# Patient Record
Sex: Male | Born: 1950
Health system: Southern US, Community
[De-identification: ages and names within clinical notes are randomized; demographics above are authoritative.]

## PROBLEM LIST (undated history)

## (undated) DIAGNOSIS — I10 Essential (primary) hypertension: Secondary | ICD-10-CM

## (undated) DIAGNOSIS — M109 Gout, unspecified: Secondary | ICD-10-CM

## (undated) HISTORY — DX: Gout, unspecified: M10.9

## (undated) HISTORY — DX: Essential (primary) hypertension: I10

---
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
## 2000-08-16 ENCOUNTER — Emergency Department (HOSPITAL_COMMUNITY): Admission: EM | Admit: 2000-08-16 | Discharge: 2000-08-16 | Payer: Self-pay | Admitting: Emergency Medicine

## 2000-08-31 ENCOUNTER — Emergency Department (HOSPITAL_COMMUNITY): Admission: EM | Admit: 2000-08-31 | Discharge: 2000-08-31 | Payer: Self-pay | Admitting: Emergency Medicine

## 2003-11-17 ENCOUNTER — Ambulatory Visit (HOSPITAL_COMMUNITY): Admission: RE | Admit: 2003-11-17 | Discharge: 2003-11-17 | Payer: Self-pay | Admitting: Family Medicine
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
## 2019-07-22 ENCOUNTER — Ambulatory Visit: Payer: Medicare HMO | Attending: Internal Medicine

## 2019-07-22 ENCOUNTER — Ambulatory Visit
Admission: EM | Admit: 2019-07-22 | Discharge: 2019-07-22 | Disposition: A | Discharge status: Home / Self Care | Payer: Self-pay

## 2019-07-22 DIAGNOSIS — Z20822 Contact with and (suspected) exposure to covid-19: Secondary | ICD-10-CM

## 2019-07-24 LAB — NOVEL CORONAVIRUS, NAA: SARS-CoV-2, NAA: NOT DETECTED

## 2019-09-03 ENCOUNTER — Ambulatory Visit: Payer: Self-pay | Admitting: Podiatry

## 2019-09-08 ENCOUNTER — Encounter: Payer: Self-pay | Admitting: Sports Medicine

## 2019-09-08 ENCOUNTER — Ambulatory Visit: Payer: Medicare HMO | Admitting: Sports Medicine

## 2019-09-08 ENCOUNTER — Other Ambulatory Visit: Payer: Self-pay

## 2019-09-08 ENCOUNTER — Other Ambulatory Visit: Payer: Self-pay | Admitting: Sports Medicine

## 2019-09-08 ENCOUNTER — Ambulatory Visit (INDEPENDENT_AMBULATORY_CARE_PROVIDER_SITE_OTHER): Payer: Medicare HMO

## 2019-09-08 VITALS — BP 175/80 | HR 79 | Temp 97.2°F

## 2019-09-08 DIAGNOSIS — M79671 Pain in right foot: Secondary | ICD-10-CM

## 2019-09-08 DIAGNOSIS — I739 Peripheral vascular disease, unspecified: Secondary | ICD-10-CM

## 2019-09-08 DIAGNOSIS — M25572 Pain in left ankle and joints of left foot: Secondary | ICD-10-CM | POA: Diagnosis not present

## 2019-09-08 DIAGNOSIS — M79672 Pain in left foot: Secondary | ICD-10-CM | POA: Diagnosis not present

## 2019-09-08 DIAGNOSIS — M659 Synovitis and tenosynovitis, unspecified: Secondary | ICD-10-CM | POA: Diagnosis not present

## 2019-09-08 DIAGNOSIS — G8929 Other chronic pain: Secondary | ICD-10-CM

## 2019-09-08 DIAGNOSIS — M10042 Idiopathic gout, left hand: Secondary | ICD-10-CM | POA: Diagnosis not present

## 2019-09-08 DIAGNOSIS — Z8739 Personal history of other diseases of the musculoskeletal system and connective tissue: Secondary | ICD-10-CM

## 2019-09-08 DIAGNOSIS — M1812 Unilateral primary osteoarthritis of first carpometacarpal joint, left hand: Secondary | ICD-10-CM | POA: Diagnosis not present

## 2019-09-08 DIAGNOSIS — M19042 Primary osteoarthritis, left hand: Secondary | ICD-10-CM | POA: Diagnosis not present

## 2019-09-08 DIAGNOSIS — M65971 Unspecified synovitis and tenosynovitis, right ankle and foot: Secondary | ICD-10-CM

## 2019-09-08 DIAGNOSIS — M25571 Pain in right ankle and joints of right foot: Secondary | ICD-10-CM | POA: Diagnosis not present

## 2019-09-08 DIAGNOSIS — M19079 Primary osteoarthritis, unspecified ankle and foot: Secondary | ICD-10-CM | POA: Diagnosis not present

## 2019-09-08 DIAGNOSIS — M65972 Unspecified synovitis and tenosynovitis, left ankle and foot: Secondary | ICD-10-CM

## 2019-09-08 DIAGNOSIS — M79642 Pain in left hand: Secondary | ICD-10-CM | POA: Diagnosis not present

## 2019-09-08 NOTE — Patient Instructions (Addendum)
For tennis shoes recommend:  Anne Shutter [employer]is New balance [place] Can be purchased at [employer] or [employer]  Vionic  SAS Can be purchased at [employer] or [employer]   For work shoes recommend: Pharmacologist Work [employer]  Can be purchased at a variety of places or Scientist, product/process development   For casual shoes recommend: Vionic  Can be purchased at [employer] or [employer]   For Over [employer] recommend: [employer] Can be purchased in office/[facility] [employer] Can be purchased at [employer] or [employer] Can be purchased at [employer] for patients with Gout  Gout defined-Gout occurs when urate crystals accumulate in your joint causing the inflammation and intense pain of gout attack.  Urate crystals can form when you have high levels of uric acid in your blood.  Your body produces uric acid when it breaks down prurines-substances that are found naturally in your body, as well as in certain foods such as organ meats, anchioves, herring, asparagus, and mushrooms.  Normally uric acid dissolves in your blood and passes through your kidneys into your urine.  But sometimes your body either produces too much uric acid or your kidneys excrete too little uric acid.  When this happens, uric acid can build up, forming sharp needle-like urate crystals in a joint or surrounding tissue that cause pain, inflammation and swelling.    Gout is characterized by sudden, severe attacks of pain, redness and tenderness in joints, often the joint at the base of the big toe.  Gout is complex form of arthritis that can affect anyone.  Men are more likely to get gout but women become increasingly more susceptible to gout after menopause.  An acute attack of gout can wake you up in the middle of the night with the sensation that your big toe is on fire.  The affected joint is hot, swollen and so tender that even the weight or the sheet on it may seem intolerable.  If you  experience symptoms of an acute gout attack it is important to your doctor as soon as the symptoms start.  Gout that goes untreated can lead to worsening pain and joint damage.  Risk Factors:  You are more likely to develop gout if you have high levels of uric acid in your body.    Factors that increase the uric acid level in your body include:  Lifestyle factors.  Excessive alcohol use-generally more than two drinks a day for men and more than one for women increase the risk of gout.  Medical conditions.  Certain conditions make it more likely that you will develop gout.  These include hypertension, and chronic conditions such as diabetes, high levels of fat and cholesterol in the blood, and narrowing of the arteries.  Certain medications.  The uses of Thiazide diuretics- commonly used to treat hypertension and low dose aspirin can also increase uric acid levels.  Family history of gout.  If other members of your family have had gout, you are more likely to develop the disease.  Age and sex. Gout occurs more often in men than it does in women, primarily because women tend to have lower uric acid levels than men do.  Men are more likely to develop gout earlier usually between the ages of 73-50- whereas women generally develop signs and symptoms after menopause.    Tests and diagnosis:  Tests to help diagnose gout may include:  Blood test.  Your doctor may recommend  a blood test to measure the uric acid level in your blood .  Blood tests can be misleading, though.  Some people have high uric acid levels but never experience gout.  And some people have signs and symptoms of gout, but don't have unusual levels of uric acid in their blood.  Joint fluid test.  Your doctor may use a needle to draw fluid from your affected joint.  When examined under the microscope, your joint fluid may reveal urate crystals.  Treatment:  Treatment for gout usually involves medications.  What medications you and  your doctor choose will be based on your current health and other medications you currently take.  Gout medications can be used to treat acute gout attacks and prevent future attacks as well as reduce your risk of complications from gout such as the development of tophi from urate crystal deposits.  Alternative medicine:   Certain foods have been studied for their potential to lower uric acid levels, including:  Coffee.  Studies have found an association between coffee drinking (regular and decaf) and lower uric acid levels.  The evidence is not enough to encourage non-coffee drinkers to start, but it may give clues to new ways of treating gout in the future.  Vitamin C.  Supplements containing vitamin C may reduce the levels of uric acid in your blood.  However, vitamin as a treatment for gout. Don't assume that if a little vitamin C is good, than lots is better.  Megadoses of vitamin C may increase your bodies uric acid levels.  Cherries.  Cherries have been associated with lower levels of uric acid in studies, but it isn't clear if they have any effect on gout signs and symptoms.  Eating more cherries and other dar-colored fruits, such as blackberries, blueberries, purple grapes and raspberries, may be a safe way to support your gout treatment.    Lifestyle/Diet Recommendations:   Drink 8 to 16 cups ( about 2 to 4 liters) of fluid each day, with at least half being water.  Avoid alcohol  Eat a moderate amount of protein, preferably from healthy sources, such as low-fat or fat-free dairy, tofu, eggs, and nut butters.  Limit you daily intake of meat, fish, and poultry to 4 to 6 ounces.  Avoid high fat meats and desserts.  Decrease you intake of shellfish, beef, lamb, pork, eggs and cheese.  Choose a good source of vitamin C daily such as citrus fruits, strawberries, broccoli,  brussel sprouts, papaya, and cantaloupe.   Choose a good source of vitamin A every other day such as yellow  fruits, or dark green/yellow vegetables.  Avoid drastic weigh reduction or fasting.  If weigh loss is desired lose it over a period of several months.  See "dietary considerations.." chart for specific food recommendations.  Dietary Considerations for people with Gout  Food with negligible purine content (0-15 mg of purine nitrogen per 100 grams food)  May use as desired except on calorie variations  Non fat milk Cocoa Cereals (except in list II) Hard candies  Buttermilk Carbonated drinks Vegetables (except in list II) Sherbet  Coffee Fruits Sugar Honey  Tea Cottage Cheese Gelatin-jell-o Salt  Fruit juice Breads Angel food Cake   Herbs/spices Jams/Jellies [employer]    Foods that do not contain excessive purine content, but must be limited due to fat content  Cream Eggs Oil and Salad Dressing  Half and Half Peanut Butter Chocolate  Whole Milk Cakes Potato Chips  Butter Ice  Cream Fried Foods  Cheese Nuts Waffles, pancakes   List II: Food with moderate purine content (50-150 mg of purine nitrogen per 100 grams of food)  Limit total amount each day to 5 oz. cooked Lean meat, other than those on list III   Poultry, other than those on list III Fish, other than those on list III   Seafood, other than those on list III  These foods may be used occasionally  Peas Lentils Bran  Spinach Oatmeal Dried Beans and Peas  Asparagus Wheat Germ Mushrooms   Additional information about meat choices  Choose fish and poultry, particularly without skin, often.  Select lean, well trimmed cuts of meat.  Avoid all fatty meats, bacon , sausage, fried meats, fried fish, or poultry, luncheon meats, cold cuts, hot dogs, meats canned or frozen in gravy, spareribs and frozen and packaged prepared meats.   List III: Foods with HIGH purine content / Foods to AVOID (150-800 mg of purine nitrogen per 100 grams of food)  Anchovies Herring Meat Broths  Liver Mackerel Meat Extracts  Kidney  Scallops Meat Drippings  Sardines Wild Game Mincemeat  Sweetbreads Goose Gravy  Heart Tongue Yeast, baker's and brewers   Commercial soups made with any of the foods listed in List II or List III  In addition avoid all alcoholic beverages

## 2019-09-08 NOTE — Progress Notes (Signed)
Subjective:  Wayne Morgan is a 69 y.o. male patient who presents to office for evaluation of bilateral foot and ankle pain. Patient complains of continued pain in the feet and ankles for years reports that pain is has worsened over the last 6 to 8 months in the ankles at the dorsal and medial aspects reports that there is sharp pain when he is walking throbs even when sitting and a little swelling from time to time.  Patient also reports that he has a history of gout and has used cherry juice as well as Tylenol but still experiences pain patient reports that he is trying to be as simple with treatment and as holistic as possible because he had an issue in the past where he was on several blood pressure medicines that were interacting with each other so now he only takes lisinopril 40 mg and does want to limit having to take anything in addition. Patient denies any other pedal complaints. Denies injury/trip/fall/sprain/any causative factors.   Review of Systems  All other systems reviewed and are negative.    There are no problems to display for this patient.   Current Outpatient Medications on File Prior to Visit  Medication Sig Dispense Refill  . lisinopril (ZESTRIL) 40 MG tablet Take 40 mg by mouth daily.     No current facility-administered medications on file prior to visit.    No Known Allergies  Objective:  General: Alert and oriented x3 in no acute distress  Dermatology: No open lesions bilateral lower extremities, no webspace macerations, no ecchymosis bilateral, all nails x 10 are well manicured.  Vascular: Dorsalis Pedis and Posterior Tibial pedal pulses palpable, brawny discoloration to both feet consistent with chronic venous stasis, capillary Fill Time 3 seconds,(+) pedal hair growth bilateral, very minimal edema bilateral lower extremities, Temperature gradient within normal limits.  Neurology: Gross sensation intact via light touch bilateral.  Musculoskeletal:  Mild tenderness with palpation at anterior ankles and medial ankles bilateral, negative talar tilt, Negative tib-fib stress, No instability. No pain with calf compression bilateral. Range of motion within normal limits with mild guarding on ankles.  Significant pes planus and bunion deformity bilateral.  Strength within normal limits in all groups bilateral.   Gait: Mildly antalgic gait excessive pronation wide-based.  Xrays  Right and left foot and ankle   Impression: Diffuse arthritis in ankles midfoot and big toe joint bilateral.  Midtarsal breech supportive of pes planus deformity.  Increased intermetatarsal angle supportive of bunion deformity bilateral.  No fracture or dislocation.  Soft tissue margins within normal limits.  Assessment and Plan: Problem List Items Addressed This Visit    None    Visit Diagnoses    Bilateral foot pain    -  Primary   Chronic pain of both ankles       Arthritis of foot       Arthritis of ankle       History of gout       PVD (peripheral vascular disease) (HCC)       Relevant Medications   lisinopril (ZESTRIL) 40 MG tablet       -Complete examination performed -Xrays reviewed -Discussed treatment options for chronic foot/ankle pain with history of gout and early PVD -Gout education provided -Recommend holistic treatment for inflammation and arthritis including turmeric and black seed oil -Recommend range of motion and gentle stretching and strengthening exercises -Recommend patient to get supportive shoes, shoe list was given -Patient to return to office and 4  to 6 weeks or sooner if condition worsens.  Advised patient if he is doing well at next visit we will slowly let him start to become more active working on walking 1 mile 3 times per week however at this time I advised patient to focus on getting good shoes and conditioning to help him become active with very minimal pain  Landis Martins, DPM

## 2019-09-10 ENCOUNTER — Telehealth: Payer: Self-pay | Admitting: Sports Medicine

## 2019-09-10 NOTE — Telephone Encounter (Signed)
pts wife called yesterday late afternoon asking where it was that was recommended for pt to go get shoes fitted appropriately.They are wanting someone certified in shoe fittings.   I returned call and left message that [facility] [address] has certified people(Melissa or PJ) that can fit them for shoes or try [employer]. I told her sometimes if they mention we sent them to [employer] [facility] they will give a small discount. I also left my number if she wanted to call me back.

## 2019-10-13 ENCOUNTER — Ambulatory Visit: Payer: Medicare HMO | Admitting: Sports Medicine

## 2019-12-08 ENCOUNTER — Ambulatory Visit
Admission: EM | Admit: 2019-12-08 | Discharge: 2019-12-08 | Disposition: A | Discharge status: Home / Self Care | Payer: Medicare HMO | Attending: Emergency Medicine | Admitting: Emergency Medicine

## 2019-12-08 DIAGNOSIS — L989 Disorder of the skin and subcutaneous tissue, unspecified: Secondary | ICD-10-CM | POA: Diagnosis not present

## 2019-12-08 MED ORDER — DOXYCYCLINE HYCLATE 100 MG PO CAPS: 100.0000 mg | ORAL_CAPSULE | Freq: Two times a day (BID) | ORAL | 0 refills | Status: DC

## 2019-12-08 NOTE — ED Provider Notes (Signed)
[facility]    CSN: [id number] Arrival date & time: 12/08/19  1710      History   Chief Complaint Chief Complaint  Patient presents with  . Insect Bite    HPI Wayne Morgan is a 69 y.o. male presenting for right jaw knot x2 days.  States an insect bit him there: Unknown what type of bug.  No pain, though noticed significant swelling.  No drainage or fever, dental pain.   History reviewed. No pertinent past medical history.  There are no problems to display for this patient.   History reviewed. No pertinent surgical history.     Home Medications    Prior to Admission medications   Medication Sig Start Date End Date Taking? Authorizing Provider  doxycycline (VIBRAMYCIN) 100 MG capsule Take 1 capsule (100 mg total) by mouth 2 (two) times daily. 12/08/19   Hall-Potvin, [place], PA-C  lisinopril (ZESTRIL) 40 MG tablet Take 40 mg by mouth daily.    [provider]    Family History History reviewed. No pertinent family history.  Social History Social History   Tobacco Use  . Smoking status: Never Smoker  . Smokeless tobacco: Never Used  Substance Use Topics  . Alcohol use: Not Currently  . Drug use: Not Currently     Allergies   Patient has no known allergies.   Review of Systems As per HPI   Physical Exam Triage Vital Signs ED Triage Vitals [12/08/19 1729]  Enc Vitals Group     BP (!) 179/93     Pulse Rate 78     Resp 18     Temp 98 F (36.7 C)     Temp Source Oral     SpO2 97 %     Weight      Height      Head Circumference      Peak Flow      Pain Score 0     Pain Loc      Pain Edu?      Excl. in [place]?    No data found.  Updated Vital Signs BP (!) 179/93 (BP Location: Left Arm)   Pulse 78   Temp 98 F (36.7 C) (Oral)   Resp 18   SpO2 97%   Visual Acuity Right Eye Distance:   Left Eye Distance:   Bilateral Distance:    Right Eye Near:   Left Eye Near:    Bilateral Near:     Physical Exam  Constitutional:      General: He is not in acute distress. HENT:     Head: Normocephalic and atraumatic.  Eyes:     General: No scleral icterus.    Pupils: Pupils are equal, round, and reactive to light.  Cardiovascular:     Rate and Rhythm: Normal rate.  Pulmonary:     Effort: Pulmonary effort is normal. No respiratory distress.     Breath sounds: No wheezing.  Skin:    Coloration: Skin is not jaundiced or pale.     Comments: 1.5 cm fluctuant subcutaneous mass to right jaw without tenderness or erythema or warmth.  Needle aspiration with purulence  Neurological:     Mental Status: He is alert and oriented to person, place, and time.      UC Treatments / Results  Labs (all labs ordered are listed, but only abnormal results are displayed) Labs Reviewed - No data to display  EKG   Radiology No results found.  Procedures  Incision and Drainage  Date/Time: 12/08/2019 6:13 PM Performed by: Shea Evans, PA-C Authorized by: Shea Evans, PA-C   Consent:    Consent obtained:  Verbal   Consent given by:  Patient   Risks discussed:  Bleeding, incomplete drainage, pain and damage to other organs   Alternatives discussed:  No treatment Universal protocol:    Patient identity confirmed:  Verbally with patient Location:    Type:  Abscess   Location: Right jaw. Pre-procedure details:    Skin preparation:  Betadine Anesthesia (see MAR for exact dosages):    Anesthesia method:  Local infiltration   Local anesthetic:  Lidocaine 2% w/o epi Procedure type:    Complexity:  Simple Procedure details:    Needle aspiration: yes     Needle size:  22 G   Incision types:  Single straight   Incision depth:  Subcutaneous   Scalpel blade:  11   Wound management:  Probed and deloculated, irrigated with saline and extensive cleaning   Drainage:  Purulent   Drainage amount:  Moderate   Wound treatment:  Wound left open   Packing materials:  None Post-procedure  details:    Patient tolerance of procedure:  Tolerated well, no immediate complications   (including critical care time)  Medications Ordered in UC Medications - No data to display  Initial Impression / Assessment and Plan / UC Course  I have reviewed the triage vital signs and the nursing notes.  Pertinent labs & imaging results that were available during my care of the patient were reviewed by me and considered in my medical decision making (see chart for details).     Patient febrile, nontoxic in office today.  I&D performed: Patient tolerated this well.  Will follow with doxycycline, treat supportively as outlined below.  Return precautions discussed, patient verbalized understanding and is agreeable to plan. Final Clinical Impressions(s) / UC Diagnoses   Final diagnoses:  Skin lesion     Discharge Instructions     Apply ice for 20 minutes every 2-3 hours. Take Tylenol, ibuprofen as needed for pain. Please start antibiotic tonight: Take with breakfast and dinner daily for the next 5 days. Return for worsening pain, swelling, fever.    ED Prescriptions    Medication Sig Dispense Auth. Provider   doxycycline (VIBRAMYCIN) 100 MG capsule Take 1 capsule (100 mg total) by mouth 2 (two) times daily. 20 capsule Hall-Potvin, [place], PA-C     PDMP not reviewed this encounter.   Hall-Potvin, [place], [place] 12/08/19 1814

## 2019-12-08 NOTE — Discharge Instructions (Signed)
Apply ice for 20 minutes every 2-3 hours. Take Tylenol, ibuprofen as needed for pain. Please start antibiotic tonight: Take with breakfast and dinner daily for the next 5 days. Return for worsening pain, swelling, fever.

## 2019-12-08 NOTE — ED Triage Notes (Signed)
Pt c/o insect bite to rt side of face 2 days ago. Raised area noted with no drainage.

## 2019-12-19 DIAGNOSIS — Z823 Family history of stroke: Secondary | ICD-10-CM | POA: Diagnosis not present

## 2019-12-19 DIAGNOSIS — Z6832 Body mass index (BMI) 32.0-32.9, adult: Secondary | ICD-10-CM | POA: Diagnosis not present

## 2019-12-19 DIAGNOSIS — Z803 Family history of malignant neoplasm of breast: Secondary | ICD-10-CM | POA: Diagnosis not present

## 2019-12-19 DIAGNOSIS — G8929 Other chronic pain: Secondary | ICD-10-CM | POA: Diagnosis not present

## 2019-12-19 DIAGNOSIS — Z8249 Family history of ischemic heart disease and other diseases of the circulatory system: Secondary | ICD-10-CM | POA: Diagnosis not present

## 2019-12-19 DIAGNOSIS — I10 Essential (primary) hypertension: Secondary | ICD-10-CM | POA: Diagnosis not present

## 2019-12-19 DIAGNOSIS — E669 Obesity, unspecified: Secondary | ICD-10-CM | POA: Diagnosis not present

## 2019-12-19 DIAGNOSIS — Z833 Family history of diabetes mellitus: Secondary | ICD-10-CM | POA: Diagnosis not present

## 2019-12-19 DIAGNOSIS — Z008 Encounter for other general examination: Secondary | ICD-10-CM | POA: Diagnosis not present

## 2020-04-14 DIAGNOSIS — R69 Illness, unspecified: Secondary | ICD-10-CM | POA: Diagnosis not present

## 2020-04-26 DIAGNOSIS — Z13220 Encounter for screening for lipoid disorders: Secondary | ICD-10-CM | POA: Diagnosis not present

## 2020-04-26 DIAGNOSIS — I10 Essential (primary) hypertension: Secondary | ICD-10-CM | POA: Diagnosis not present

## 2020-04-26 DIAGNOSIS — M6281 Muscle weakness (generalized): Secondary | ICD-10-CM | POA: Diagnosis not present

## 2020-04-26 DIAGNOSIS — M2550 Pain in unspecified joint: Secondary | ICD-10-CM | POA: Diagnosis not present

## 2020-04-26 DIAGNOSIS — M109 Gout, unspecified: Secondary | ICD-10-CM | POA: Diagnosis not present

## 2020-05-16 DIAGNOSIS — G5703 Lesion of sciatic nerve, bilateral lower limbs: Secondary | ICD-10-CM | POA: Diagnosis not present

## 2020-05-16 DIAGNOSIS — D649 Anemia, unspecified: Secondary | ICD-10-CM | POA: Diagnosis not present

## 2020-05-16 DIAGNOSIS — E790 Hyperuricemia without signs of inflammatory arthritis and tophaceous disease: Secondary | ICD-10-CM | POA: Diagnosis not present

## 2020-05-16 DIAGNOSIS — R700 Elevated erythrocyte sedimentation rate: Secondary | ICD-10-CM | POA: Diagnosis not present

## 2020-05-16 DIAGNOSIS — I10 Essential (primary) hypertension: Secondary | ICD-10-CM | POA: Diagnosis not present

## 2020-05-16 DIAGNOSIS — E782 Mixed hyperlipidemia: Secondary | ICD-10-CM | POA: Diagnosis not present

## 2020-05-16 DIAGNOSIS — R748 Abnormal levels of other serum enzymes: Secondary | ICD-10-CM | POA: Diagnosis not present

## 2020-05-16 DIAGNOSIS — Z6829 Body mass index (BMI) 29.0-29.9, adult: Secondary | ICD-10-CM | POA: Diagnosis not present

## 2020-06-15 DIAGNOSIS — I10 Essential (primary) hypertension: Secondary | ICD-10-CM | POA: Diagnosis not present

## 2020-06-15 DIAGNOSIS — R748 Abnormal levels of other serum enzymes: Secondary | ICD-10-CM | POA: Diagnosis not present

## 2020-06-15 DIAGNOSIS — D649 Anemia, unspecified: Secondary | ICD-10-CM | POA: Diagnosis not present

## 2020-06-15 DIAGNOSIS — E782 Mixed hyperlipidemia: Secondary | ICD-10-CM | POA: Diagnosis not present

## 2020-06-15 DIAGNOSIS — Z6828 Body mass index (BMI) 28.0-28.9, adult: Secondary | ICD-10-CM | POA: Diagnosis not present

## 2020-06-15 DIAGNOSIS — E790 Hyperuricemia without signs of inflammatory arthritis and tophaceous disease: Secondary | ICD-10-CM | POA: Diagnosis not present

## 2020-06-15 DIAGNOSIS — R700 Elevated erythrocyte sedimentation rate: Secondary | ICD-10-CM | POA: Diagnosis not present

## 2020-07-28 ENCOUNTER — Ambulatory Visit: Payer: Self-pay | Admitting: Family

## 2020-07-28 NOTE — Progress Notes (Deleted)
   Office Visit Note  Patient: Wayne Morgan             Date of Birth: [birth year]           MRN: [id number]             PCP: Maretta Bees, PA Referring: Maretta Bees, [place] Visit Date: 07/29/2020 Occupation: @GUAROCC @  Subjective:  No chief complaint on file.   History of Present Illness: Wayne Morgan is a 70 y.o. male here for evaluation of joint pain and elevated uric acid and CRP.***     Activities of Daily Living:  Patient reports morning stiffness for *** {minute/hour:19697}.   Patient {ACTIONS;DENIES/REPORTS:21021675::"Denies"} nocturnal pain.  Difficulty dressing/grooming: {ACTIONS;DENIES/REPORTS:21021675::"Denies"} Difficulty climbing stairs: {ACTIONS;DENIES/REPORTS:21021675::"Denies"} Difficulty getting out of chair: {ACTIONS;DENIES/REPORTS:21021675::"Denies"} Difficulty using hands for taps, buttons, cutlery, and/or writing: {ACTIONS;DENIES/REPORTS:21021675::"Denies"}  No Rheumatology ROS completed.   PMFS History:  There are no problems to display for this patient.   No past medical history on file.  No family history on file. No past surgical history on file. Social History   Social History Narrative  . Not on file    There is no immunization history on file for this patient.   Objective: Vital Signs: There were no vitals taken for this visit.   Physical Exam   Musculoskeletal Exam: ***  CDAI Exam: CDAI Score: -- Patient Global: --; Provider Global: -- Swollen: --; Tender: -- Joint Exam 07/29/2020   No joint exam has been documented for this visit   There is currently no information documented on the homunculus. Go to the Rheumatology activity and complete the homunculus joint exam.  Investigation: No additional findings.  Imaging: No results found.  Recent Labs: No results found for: WBC, HGB, PLT, NA, K, CL, CO2, GLUCOSE, BUN, CREATININE, BILITOT, ALKPHOS, AST, ALT, PROT, ALBUMIN, CALCIUM, GFRAA, QFTBGOLD,  QFTBGOLDPLUS  Speciality Comments: No specialty comments available.  Procedures:  No procedures performed Allergies: Patient has no known allergies.   Assessment / Plan:     Visit Diagnoses: No diagnosis found.  Orders: No orders of the defined types were placed in this encounter.  No orders of the defined types were placed in this encounter.   Face-to-face time spent with patient was *** minutes. Greater than 50% of time was spent in counseling and coordination of care.  Follow-Up Instructions: No follow-ups on file.   09/26/2020, MD  Note - This record has been created using [employer].  Chart creation errors have been sought, but may not always  have been located. Such creation errors do not reflect on  the standard of medical care.

## 2020-07-29 ENCOUNTER — Ambulatory Visit: Payer: Medicare HMO | Admitting: Internal Medicine

## 2020-08-01 ENCOUNTER — Encounter: Payer: Self-pay | Admitting: Family

## 2020-08-01 ENCOUNTER — Telehealth: Payer: Self-pay | Admitting: *Deleted

## 2020-08-01 ENCOUNTER — Other Ambulatory Visit: Payer: Self-pay

## 2020-08-01 ENCOUNTER — Ambulatory Visit (INDEPENDENT_AMBULATORY_CARE_PROVIDER_SITE_OTHER): Payer: Medicare HMO | Admitting: Family

## 2020-08-01 VITALS — BP 200/120 | HR 81 | Temp 97.7°F | Resp 16 | Ht 70.0 in | Wt 193.2 lb

## 2020-08-01 DIAGNOSIS — E538 Deficiency of other specified B group vitamins: Secondary | ICD-10-CM

## 2020-08-01 DIAGNOSIS — M25571 Pain in right ankle and joints of right foot: Secondary | ICD-10-CM

## 2020-08-01 DIAGNOSIS — E559 Vitamin D deficiency, unspecified: Secondary | ICD-10-CM | POA: Diagnosis not present

## 2020-08-01 DIAGNOSIS — Z87891 Personal history of nicotine dependence: Secondary | ICD-10-CM

## 2020-08-01 DIAGNOSIS — Z789 Other specified health status: Secondary | ICD-10-CM

## 2020-08-01 DIAGNOSIS — F321 Major depressive disorder, single episode, moderate: Secondary | ICD-10-CM

## 2020-08-01 DIAGNOSIS — M1A00X0 Idiopathic chronic gout, unspecified site, without tophus (tophi): Secondary | ICD-10-CM

## 2020-08-01 DIAGNOSIS — Z1159 Encounter for screening for other viral diseases: Secondary | ICD-10-CM

## 2020-08-01 DIAGNOSIS — I10 Essential (primary) hypertension: Secondary | ICD-10-CM

## 2020-08-01 DIAGNOSIS — Z7689 Persons encountering health services in other specified circumstances: Secondary | ICD-10-CM | POA: Diagnosis not present

## 2020-08-01 DIAGNOSIS — M6281 Muscle weakness (generalized): Secondary | ICD-10-CM

## 2020-08-01 DIAGNOSIS — M25471 Effusion, right ankle: Secondary | ICD-10-CM

## 2020-08-01 DIAGNOSIS — N529 Male erectile dysfunction, unspecified: Secondary | ICD-10-CM

## 2020-08-01 MED ORDER — CLONIDINE HCL 0.1 MG PO TABS
0.1000 mg | ORAL_TABLET | Freq: Once | ORAL | Status: AC
  Administered 2020-08-01: 0.1 mg via ORAL

## 2020-08-01 MED ORDER — INDOMETHACIN 50 MG PO CAPS: 50.0000 mg | ORAL_CAPSULE | Freq: Three times a day (TID) | ORAL | 0 refills | Status: DC

## 2020-08-01 MED ORDER — AMLODIPINE BESYLATE 5 MG PO TABS: 5.0000 mg | ORAL_TABLET | Freq: Every day | ORAL | 0 refills | Status: DC

## 2020-08-01 NOTE — Patient Instructions (Addendum)
- check blood pressure and record on log.Notify provider if B/p > 140/90 Notify provider or go to ED if symptoms worsen.   - please Psychiatry service may use the telephone numbers provided today or other available Psychiatry service.  - Please place form where can be seen by EMS  [email or web address]">  DASH Eating Plan DASH stands for Dietary Approaches to Stop Hypertension. The DASH eating plan is a healthy eating plan that has been shown to:  Reduce high blood pressure (hypertension).  Reduce your risk for type 2 diabetes, heart disease, and stroke.  Help with weight loss. What are tips for following this plan? Reading food labels  Check food labels for the amount of salt (sodium) per serving. Choose foods with less than 5 percent of the Daily Value of sodium. Generally, foods with less than 300 milligrams (mg) of sodium per serving fit into this eating plan.  To find whole grains, look for the word "whole" as the first word in the ingredient list. Shopping  Buy products labeled as "low-sodium" or "no salt added."  Buy fresh foods. Avoid canned foods and pre-made or frozen meals. Cooking  Avoid adding salt when cooking. Use salt-free seasonings or herbs instead of table salt or sea salt. Check with your health care provider or pharmacist before using salt substitutes.  Do not fry foods. Cook foods using healthy methods such as baking, boiling, grilling, roasting, and broiling instead.  Cook with heart-healthy oils, such as olive, canola, avocado, soybean, or sunflower oil. Meal planning  Eat a balanced diet that includes: ? 4 or more servings of fruits and 4 or more servings of vegetables each day. Try to fill one-half of your plate with fruits and vegetables. ? 6-8 servings of whole grains each day. ? Less than 6 oz (170 g) of lean meat, poultry, or fish each day. A 3-oz (85-g) serving of meat is about the same size as a deck of  cards. One egg equals 1 oz (28 g). ? 2-3 servings of low-fat dairy each day. One serving is 1 cup (237 mL). ? 1 serving of nuts, seeds, or beans 5 times each week. ? 2-3 servings of heart-healthy fats. Healthy fats called omega-3 fatty acids are found in foods such as walnuts, flaxseeds, fortified milks, and eggs. These fats are also found in cold-water fish, such as sardines, salmon, and mackerel.  Limit how much you eat of: ? Canned or prepackaged foods. ? Food that is high in trans fat, such as some fried foods. ? Food that is high in saturated fat, such as fatty meat. ? Desserts and other sweets, sugary drinks, and other foods with added sugar. ? Full-fat dairy products.  Do not salt foods before eating.  Do not eat more than 4 egg yolks a week.  Try to eat at least 2 vegetarian meals a week.  Eat more home-cooked food and less restaurant, buffet, and fast food.   Lifestyle  When eating at a restaurant, ask that your food be prepared with less salt or no salt, if possible.  If you drink alcohol: ? Limit how much you use to:  0-1 drink a day for women who are not pregnant.  0-2 drinks a day for men. ? Be aware of how much alcohol is in your drink. In the [place] one drink equals one 12 oz bottle of beer (355 mL), one 5 oz glass of wine (148 mL), or one 1 oz glass of hard liquor (44 mL). General information  Avoid eating more than 2,300 mg of salt a day. If you have hypertension, you may need to reduce your sodium intake to 1,500 mg a day.  Work with your health care provider to maintain a healthy body weight or to lose weight. Ask what an ideal weight is for you.  Get at least 30 minutes of exercise that causes your heart to beat faster (aerobic exercise) most days of the week. Activities may include walking, swimming, or biking.  Work with your health care provider or dietitian to adjust your eating plan to your individual calorie needs. What foods should I eat? Fruits All  fresh, dried, or frozen fruit. Canned fruit in natural juice (without added sugar). Vegetables Fresh or frozen vegetables (raw, steamed, roasted, or grilled). Low-sodium or reduced-sodium tomato and vegetable juice. Low-sodium or reduced-sodium tomato sauce and tomato paste. Low-sodium or reduced-sodium canned vegetables. Grains Whole-grain or whole-wheat bread. Whole-grain or whole-wheat pasta. Brown rice. Orpah Cobb. Bulgur. Whole-grain and low-sodium cereals. Pita bread. Low-fat, low-sodium crackers. Whole-wheat flour tortillas. Meats and other proteins Skinless chicken or [place]. Ground chicken or [place]. Pork with fat trimmed off. Fish and seafood. Egg whites. Dried beans, peas, or lentils. Unsalted nuts, nut butters, and seeds. Unsalted canned beans. Lean cuts of beef with fat trimmed off. Low-sodium, lean precooked or cured meat, such as sausages or meat loaves. Dairy Low-fat (1%) or fat-free (skim) milk. Reduced-fat, low-fat, or fat-free cheeses. Nonfat, low-sodium ricotta or cottage cheese. Low-fat or nonfat yogurt. Low-fat, low-sodium cheese. Fats and oils Soft margarine without trans fats. Vegetable oil. Reduced-fat, low-fat, or light mayonnaise and salad dressings (reduced-sodium). Canola, safflower, olive, avocado, soybean, and sunflower oils. Avocado. Seasonings and condiments Herbs. Spices. Seasoning mixes without salt. Other foods Unsalted popcorn and pretzels. Fat-free sweets. The items listed above may not be a complete list of foods and beverages you can eat. Contact a dietitian for more information. What foods should I avoid? Fruits Canned fruit in a light or heavy syrup. Fried fruit. Fruit in cream or butter sauce. Vegetables Creamed or fried vegetables. Vegetables in a cheese sauce. Regular canned vegetables (not low-sodium or reduced-sodium). Regular canned tomato sauce and paste (not low-sodium or reduced-sodium). Regular tomato and vegetable juice (not low-sodium or  reduced-sodium). Rosita Fire. Olives. Grains Baked goods made with fat, such as croissants, muffins, or some breads. Dry pasta or rice meal packs. Meats and other proteins Fatty cuts of meat. Ribs. Fried meat. Tomasa Blase. Bologna, salami, and other precooked or cured meats, such as sausages or meat loaves. Fat from the back of a pig (fatback). Bratwurst. Salted nuts and seeds. Canned beans with added salt. Canned or smoked fish. Whole eggs or egg yolks. Chicken or [place] with skin. Dairy Whole or 2% milk, cream, and half-and-half. Whole or full-fat cream cheese. Whole-fat or sweetened yogurt. Full-fat cheese. Nondairy creamers. Whipped toppings. Processed cheese and cheese spreads. Fats and oils Butter. Stick margarine. Lard. Shortening. Ghee. Bacon fat. Tropical oils, such as coconut, palm kernel, or palm oil. Seasonings and condiments Onion salt, garlic salt, seasoned salt, table salt, and sea salt. Worcestershire sauce. Tartar sauce. Barbecue sauce. Teriyaki sauce. Soy sauce, including reduced-sodium. Steak sauce. Canned and packaged gravies. Fish sauce. Oyster sauce. Cocktail sauce. Store-bought horseradish. Ketchup. Mustard. Meat flavorings and tenderizers. Bouillon cubes. Hot sauces. Pre-made or packaged marinades. Pre-made or packaged taco seasonings. Relishes. Regular salad dressings. Other foods Salted popcorn and pretzels. The items listed above may not be a complete list of foods and beverages you should avoid. Contact  a dietitian for more information. Where to find more information  National Heart, Lung, and Blood Institute: PopSteam.is  American Heart Association: www.heart.org  Academy of Nutrition and Dietetics: www.eatright.org  National Kidney Foundation: www.kidney.org Summary  The DASH eating plan is a healthy eating plan that has been shown to reduce high blood pressure (hypertension). It may also reduce your risk for type 2 diabetes, heart disease, and stroke.  When on  the DASH eating plan, aim to eat more fresh fruits and vegetables, whole grains, lean proteins, low-fat dairy, and heart-healthy fats.  With the DASH eating plan, you should limit salt (sodium) intake to 2,300 mg a day. If you have hypertension, you may need to reduce your sodium intake to 1,500 mg a day.  Work with your health care provider or dietitian to adjust your eating plan to your individual calorie needs. This information is not intended to replace advice given to you by your health care provider. Make sure you discuss any questions you have with your health care provider. Document Revised: 05/15/2019 Document Reviewed: 05/15/2019 Elsevier Patient Education  2021 Elsevier Inc.   Amlodipine Tablets What is this medicine? AMLODIPINE (am LOE di peen) is a calcium channel blocker. It relaxes your blood vessels and decreases the amount of work the heart has to do. It treats high blood pressure and/or prevents chest pain (also called angina). This medicine may be used for other purposes; ask your health care provider or pharmacist if you have questions. COMMON BRAND NAME(S): Norvasc What should I tell my health care provider before I take this medicine? They need to know if you have any of these conditions:  heart disease  liver disease  an unusual or allergic reaction to amlodipine, other drugs, foods, dyes, or preservatives  pregnant or trying to get pregnant  breast-feeding How should I use this medicine? Take this medicine by mouth. Take it as directed on the prescription label at the same time every day. You can take it with or without food. If it upsets your stomach, take it with food. Keep taking it unless your health care provider tells you to stop. Talk to your health care provider about the use of this medicine in children. While it may be prescribed for children as young as 6 for selected conditions, precautions do apply. Overdosage: If you think you have taken too much  of this medicine contact a poison control center or emergency room at once. NOTE: This medicine is only for you. Do not share this medicine with others. What if I miss a dose? If you miss a dose, take it as soon as you can. If it is almost time for your next dose, take only that dose. Do not take double or extra doses. What may interact with this medicine? This medicine may interact with the following medications:  clarithromycin  cyclosporine  diltiazem  itraconazole  simvastatin  tacrolimus This list may not describe all possible interactions. Give your health care provider a list of all the medicines, herbs, non-prescription drugs, or dietary supplements you use. Also tell them if you smoke, drink alcohol, or use illegal drugs. Some items may interact with your medicine. What should I watch for while using this medicine? Visit your health care provider for regular checks on your progress. Check your blood pressure as directed. Ask your health care provider what your blood pressure should be. Also, find out when you should contact him or her. Do not treat yourself for coughs, colds, or pain  while you are using this medicine without asking your health care provider for advice. Some medicines may increase your blood pressure. You may get drowsy or dizzy. Do not drive, use machinery, or do anything that needs mental alertness until you know how this medicine affects you. Do not stand up or sit up quickly, especially if you are an older patient. This reduces the risk of dizzy or fainting spells. Alcohol can make you more drowsy and dizzy. Avoid alcoholic drinks. What side effects may I notice from receiving this medicine? Side effects that you should report to your doctor or health care provider as soon as possible:  allergic reactions (skin rash, itching or hives; swelling of the face, lips, or tongue)  heart attack (trouble breathing; pain or tightness in the chest, neck, back or arms;  unusually weak or tired)  low blood pressure (dizziness; feeling faint or lightheaded, falls; unusually weak or tired) Side effects that usually do not require medical attention (report these to your doctor or health care provider if they continue or are bothersome):  facial flushing  nausea  palpitations  stomach pain  sudden weight gain  swelling of the ankles, feet, hands This list may not describe all possible side effects. Call your doctor for medical advice about side effects. You may report side effects to FDA at 1-800-FDA-1088. Where should I keep my medicine? Keep out of the reach of children and pets. Store at room temperature between 20 and 25 degrees C (68 and 77 degrees F). Protect from light and moisture. Keep the container tightly closed. Get rid of any unused medicine after the expiration date. To get rid of medicines that are no longer needed or have expired:  Take the medicine to a medicine take-back program. Check with your pharmacy or law enforcement to find a location.  If you cannot return the medicine, check the label or package insert to see if the medicine should be thrown out in the garbage or flushed down the toilet. If you are not sure, ask your health care provider. If it is safe to put in the trash, empty the medicine out of the container. Mix the medicine with cat litter, dirt, coffee grounds, or other unwanted substance. Seal the mixture in a bag or container. Put it in the trash. NOTE: This sheet is a summary. It may not cover all possible information. If you have questions about this medicine, talk to your doctor, pharmacist, or health care provider.  2021 Elsevier/Gold Standard (2020-05-07 14:59:47)

## 2020-08-01 NOTE — Telephone Encounter (Signed)
Recommend Prednisone 10 mg tablet  Take 4 tablet ( 40 mg ) by mouth x 1 dose then  Take 3 tablet (30 mg ) by mouth  X 1 day then Take 2 tablet ( 20 mg ) by mouth x 1 day then Take 1 tablet ( 10 mg ) by mouth x 1 day and stop.

## 2020-08-01 NOTE — Telephone Encounter (Signed)
Patient came back into the office after trying to get his Indomethacin from the pharmacy.  The pharmacy told him that the medication is not covered by his insurance and wants an alternative.  Please Advise.

## 2020-08-01 NOTE — Progress Notes (Addendum)
Provider: Marlowe Sax FNP-C   Chantrice Hagg, Nelda Bucks, NP  Patient Care Team: Harl Wiechmann, Nelda Bucks, NP as PCP - General (Family Medicine)  Extended Emergency Contact Information Primary Emergency Contact: [facility] Address: [place],  [place] Home Phone: [phone] Relation: None Secondary Emergency Contact: [place], [place] Phone: [phone] Relation: Other  Code Status: Full Code  Goals of care: Advanced Directive information Advanced Directives 08/01/2020  Does Patient Have a Medical Advance Directive? No  Would patient like information on creating a medical advance directive? No - Patient declined     Chief Complaint  Patient presents with  . Establish Care    New Patient.    HPI:  Pt is a 70 y.o. male seen today to Establish care for medical management of chronic diseases.Has been following up at the [place].Has a medical history of Hypertension,Gout,muscle weakness among other condition.   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 He has had his COVID-19 vaccine and wears a mask. Also very stressed due to his marital issues.Whenever they have a talk his blood pressure raises above 190.this is his 4 the marriage but has lasted 25 yrs.Has not seen a Social worker. Also worrying about  1 year old daughter who is a single mother and has own issues.  He states was on four type of medication at one time for his blood pressure but he stopped taking them because he was mowing the grass one time and could not move his arms ( Paralyzed ) he read the side effects and interaction and one medication had caution and avoid being on the sun.so he stopped all the Blood pressure medication.since then he has been cautions  about taking medication.  Currently on Lisinopril 40 mg tablet daily.  Thinks his diet could be also contributing to high blood pressure. Eats biscuits and bacon.includes fruits ,vegetables boiled and fried foods.drinks sodas and coffee.  Muscle weakness - worst on arms and calf muscle.States worsening muscle loss despite the hard work he does as a Retail buyer.Has had difficulties lifting load. He enjoys playing [employer] but has not played for the past 9 months due to worsening weakness on hands afraid to drop his instrument.    Pain and swollen joint - Has swelling on right base of thumb and knuckles.Has had redness,warm and painful.Also has swelling around right ankle and tender to touch but no redness. Has been taking Motrin 200 mg tablet took 4 tablets ( 800 mg ) daily for the past 4 days.also takes OTC back and pain Bayer   Drinks tar Geri-juices to help with swelling and pain. Also drinks up to 92 oz of water daily.   Erectile dysfunction - ongoing for the past 3 years.this is contributing to his stress level ans issues in their marriage.Afraid to take any medication.  Hx of smoking -  Used to smoke 1/2 pack cigarette for 25 years.He quit smoking in [place]  - states had influenza vaccine along with his COVID-19 vaccine at [facility] on [facility] in [place] called pharmacy but was closed for lunch will try again.   - Due for PNA vac 23 and Prevnair 13 would like to wait and  obtain records from [place] thinks had them already.PNA vac 1st dose noted on current records.   - Has completed COVID-19 vaccine including booster.   - records indicates due for colonoscopy but states had [employer] Medicare representative who came to his house and was given kit to send for colon cancer screening.? Colo guard.He was never told the results.Not sure whether it was last year 2021 or 2022.thinks might be on records at [place].   - Reviewed goals of care during visit.States in case  of a cardiopulmonary event he would like to be resuscitated and transferred to the hospital with full scope of treatment including intubation and placed on ventilator. Also use I.V  fluids and antibioticif indicated. If unable to eat he would like feeding tube to be placed. MOST form has been filled out today. Reviewed goals of care and filled MOST form between 2: 15 pm -2:47 pm. I've answered question/ concern from patient to the best of my knowledge.    Past Medical History:  Diagnosis Date  . Gout   . Hypertension    Past Surgical History:  Procedure Laterality Date  . OTHER SURGICAL HISTORY  1960   Hernia     Allergies  Allergen Reactions  . Shellfish Allergy     Allergies as of 08/01/2020      Reactions   Shellfish Allergy       Medication List       Accurate as of August 01, 2020 11:59 PM. If you have any questions, ask your nurse or doctor.        STOP taking these medications   doxycycline 100 MG capsule Commonly known as: VIBRAMYCIN Stopped by: Otis Peak, CMA     TAKE these medications   amLODipine 5 MG tablet Commonly known as: NORVASC Take 1 tablet (5 mg total) by mouth daily. Started by: Sandrea Hughs, NP   lisinopril 40 MG tablet Commonly known as: ZESTRIL Take 40 mg by mouth daily.   melatonin 3 MG Tabs tablet Take 3 mg by mouth at bedtime. May take up to 55m.   MULTIVITAMIN ADULT PO Take 1 capsule by mouth daily.   predniSONE 10 MG tablet Commonly known as: DELTASONE Take 4 tablets by mouth day 1, Take 3 tablets day 2, Take 2 tablets day 3, Take one tablet day 4 then stop. What changed: additional instructions Changed by: May, Anita A, CMA   VITAMIN B12 PO Take 1 capsule by mouth daily at 2 am.   VITAMIN D PO Take 1 capsule by mouth daily.       Review of Systems  Constitutional: Negative for appetite change, chills, fatigue and fever.  HENT: Negative for congestion, hearing loss, rhinorrhea, sinus pressure, sinus pain,  sneezing, sore throat and trouble swallowing.   Eyes: Negative for pain, discharge and redness.       [facility]ears eye glasses[facility]  Respiratory: Negative for cough, chest tightness, shortness of breath and wheezing.   Cardiovascular: Negative for chest pain, palpitations and leg swelling.  Gastrointestinal: Negative for abdominal distention, abdominal pain, constipation, diarrhea, nausea and vomiting.  Endocrine: Negative for cold intolerance, heat intolerance, polydipsia, polyphagia and polyuria.  Genitourinary: Negative for difficulty urinating, dysuria, flank pain, frequency and urgency.       Voids 2-3 times at night   Musculoskeletal: Positive for arthralgias and back pain. Negative for gait problem, joint swelling, myalgias, neck pain and neck stiffness.       Right ankle swells  by the end of the day.usually warm to touch   Skin: Negative for color change, pallor and rash.  Neurological: Negative for dizziness, speech difficulty, weakness, light-headedness, numbness and headaches.       No strength on the hands for the past 9 months   Hematological: Does not bruise/bleed easily.  Psychiatric/Behavioral: Positive for sleep disturbance. Negative for agitation, behavioral problems and confusion. The patient is not nervous/anxious.        Feels depressed  Has had increased stress Sleeps 3 -4 1/2 hrs     Immunization History  Administered Date(s) Administered  . Influenza,inj,Quad PF,6+ Mos 04/27/2016, 04/26/2017, 06/19/2018  . Influenza-Unspecified 05/13/2015, 04/26/2019  . PFIZER(Purple Top)SARS-COV-2 Vaccination 08/17/2019, 09/07/2019, 04/14/2020  . PPD Test 09/08/2003  . Pneumococcal Polysaccharide-23 04/27/2016  . Td 05/06/2003  . Tdap 05/05/2013, 10/06/2015   Pertinent  Health Maintenance Due  Topic Date Due  . COLONOSCOPY (Pts 45-57yr Insurance coverage will need to be confirmed)  Never done  . PNA vac Low Risk Adult (1 of 2 - PCV13) 05/10/2018  .  INFLUENZA VACCINE  01/24/2020   Fall Risk  08/01/2020  Falls in the past year? 0  Number falls in past yr: 0  Injury with Fall? 0   Functional Status Survey:    Vitals:   08/01/20 1311 08/01/20 1504 08/01/20 1505  BP: (!) 200/110 (!) 200/116 (!) 200/120  Pulse: 81    Resp: 16    Temp: 97.7 F (36.5 C)    SpO2: 98%    Weight: 193 lb 3.7 oz (87.6 kg)    Height: _0  (1.778 m)     Body mass index is 27.73 kg/m. Physical Exam Vitals reviewed.  Constitutional:      General: He is not in acute distress.    Appearance: He is not ill-appearing.  HENT:     Head: Normocephalic.     Right Ear: Tympanic membrane, ear canal and external ear normal. There is no impacted cerumen.     Left Ear: Tympanic membrane, ear canal and external ear normal. There is no impacted cerumen.     Nose: Nose normal. No congestion or rhinorrhea.     Mouth/Throat:     Mouth: Mucous membranes are moist.     Pharynx: Oropharynx is clear. No oropharyngeal exudate or posterior oropharyngeal erythema.  Eyes:     General: No scleral icterus.       Right eye: No discharge.        Left eye: No discharge.     Extraocular Movements: Extraocular movements intact.     Conjunctiva/sclera: Conjunctivae normal.     Pupils: Pupils are equal, round, and reactive to light.  Neck:     Vascular: No carotid bruit.  Cardiovascular:     Rate and Rhythm: Normal rate.     Pulses: Normal pulses.     Heart sounds: Normal heart sounds. No murmur heard. No friction rub. No gallop.   Pulmonary:     Effort: Pulmonary effort is normal. No respiratory distress.     Breath sounds: Normal breath sounds. No wheezing, rhonchi or rales.  Chest:     Chest wall: No tenderness.  Abdominal:     General: Bowel sounds are normal. There is no distension.     Palpations: Abdomen is soft. There is no mass.     Tenderness: There is no abdominal tenderness. There is no right CVA tenderness, left CVA tenderness, guarding or rebound.   Musculoskeletal:     Right hand:  Swelling present. No tenderness. Decreased range of motion. Decreased strength. Normal strength of finger abduction. Normal sensation. Normal capillary refill. Normal pulse.     Left hand: Swelling present. Decreased range of motion. Decreased strength. Normal strength of finger abduction. Normal sensation. Normal capillary refill. Normal pulse.       Arms:     Cervical back: Normal range of motion. No rigidity or tenderness.     Left lower leg: No edema.     Comments:  ankles swollen and tender to palpation.No erythema noted.  Lymphadenopathy:     Cervical: No cervical adenopathy.  Skin:    General: Skin is warm and dry.     Coloration: Skin is not pale.     Findings: No bruising, erythema or rash.  Neurological:     Mental Status: He is alert and oriented to person, place, and time.     Cranial Nerves: No cranial nerve deficit.     Sensory: No sensory deficit.     Motor: No weakness.     Coordination: Coordination normal.     Gait: Gait normal.  Psychiatric:        Mood and Affect: Mood is anxious and depressed.        Speech: Speech normal.        Behavior: Behavior normal.        Thought Content: Thought content normal.        Judgment: Judgment normal.     Labs reviewed: No results for input(s): NA, K, CL, CO2, GLUCOSE, BUN, CREATININE, CALCIUM, MG, PHOS in the last 8760 hours. No results for input(s): AST, ALT, ALKPHOS, BILITOT, PROT, ALBUMIN in the last 8760 hours. No results for input(s): WBC, NEUTROABS, HGB, HCT, MCV, PLT in the last 8760 hours. No results found for: TSH No results found for: HGBA1C No results found for: CHOL, HDL, LDLCALC, LDLDIRECT, TRIG, CHOLHDL  Significant Diagnostic Results in last 30 days:  No results found.  Assessment/Plan 1. Uncontrolled hypertension B/p elevated.Asymptomatic.thinks could be related to his increased stress level due to marital issues,Erectile dysfunction,her  6 year old daughter's  issues,Worries about catching COVID-19 at work and his poor dietary intake. Also having pain related to swollen joints.  - Clonidine 0.2 mg tablet administered during visit without any improvement.Recommended evaluation in ED but declines. Agrees to start on Amlodipine 5 mg tablet one by mouth daily.side effects discussed and additional information provided on AVS  - Dietary and lifestyle modification recommended.DASH diet information provider. - Referral to Dietician for dietary education ordered today.Made aware dietician will call for appointment.   - Advised on Stress management and relaxation.  - check blood pressure and record on log.Notify provider if B/p > 140/90  - CBC with Differential/Platelet; Future - CMP with eGFR([facility]); Future - TSH; Future - Lipid panel; Future - amLODipine (NORVASC) 5 MG tablet; Take 1 tablet (5 mg total) by mouth daily.  Dispense: 30 tablet; Refill: 0 - Advised to follow up in 1 week and bring B/p log for review   2. Vitamin D deficiency Continue on Vitamin D supplement - Vitamin D, 1,25-dihydroxy; Future  3. Vitamin B12 deficiency Continue on Vitamin B12 supplement.  - Vitamin B12; Future  4. Encounter to establish care Reports last lab work done one year ago.  - CBC with Differential/Platelet; Future - CMP with eGFR([facility]); Future - TSH; Future - Lipid panel; Future  5. Full code status Reviewed goals of care during visit.States in case of a cardiopulmonary event he would like  to be resuscitated and transferred to the hospital with full scope of treatment including intubation and placed on ventilator. Also use I.V  fluids and antibioticif indicated. If unable to eat he would like feeding tube to be placed. MOST form has been filled out today. Reviewed goals of care and filled MOST form between 2: 15 pm -2:47 pm. I've answered question/ concern from patient to the best of my knowledge.Printed two copies of MOST form and given to patient.Advised to  place form where can be seen by EMS  6. Idiopathic chronic gout without tophus, unspecified site Ankle swollen and tender to touch without erythema. Right metartarsal joint swollen,red warm and tender to touch.Has been taking motrin 200 mg tablet takes 4 tablets ( total of 800 mg ) daily.Has used indomethacin in the past.Declined prednisone.  - Uric Acid; Future - indomethacin (INDOCIN) 50 MG capsule; Take 1 capsule (50 mg total) by mouth 3 (three) times daily with meals for 7 days.  Dispense: 21 capsule; Refill: 0  7. Pain and swelling of right ankle Swelling on multiple joints and ankles.Rule out RA  - Sedimentation rate; Future - C-reactive Protein; Future - Rheumatoid Factor; Future - Uric Acid; Future  8. Erectile dysfunction, unspecified erectile dysfunction type Ongoing for 3 years which seems to be affecting their marriages. - Area Psychiatry and counseling telephone numbers given to call for counseling. - Afraid of taking medication due to previous weakness ? Paralysis taking blood pressure medication.  - will refer to urologist if desired.     9. Encounter for hepatitis C screening test for low risk patient Low risk.  - Hep C Antibody; Future  10. Muscle weakness of extremity Progressive worsening muscle loss and weakness.unable to lift load at work. On exam bilateral hand Grips are weak.  - Ambulatory referral to Neurology  11. Former smoker Quit smoking in 1985 smoked 1/2 pack cigarette for 25 years.  Bilateral lungs clear to Auscultation.   12. Current moderate episode of major depressive disorder, unspecified whether recurrent ([place]) Reports feeling depressed due to ongoing medical issues and stress level.  No suicide ideation. Decline medication management again due to fear for side effects.Difficult to management due to fear of medication.  - Area Psychiatry telephone numbers given today.Advised to call for counseling     Family/ staff Communication: Reviewed  plan of care with patient verbalized understanding. Declined to go to ED for Blood pressure evaluation.   Labs/tests ordered:  - Hep C Antibody; Future - CBC with Differential/Platelet; Future - CMP with eGFR([facility]); Future - TSH; Future - Lipid panel; Future - Sedimentation rate; Future - C-reactive Protein; Future - Rheumatoid Factor; Future - Uric Acid; Future - Vitamin D, 1,25-dihydroxy; Future - Vitamin B12; Future  Next Appointment : 1 week for follow up high blood pressure advised to bring B/p log.will need EKG done. Fasting labs tomorrow or  in 1-2 days.   Time spent with patient 60 minutes >50% time spent counseling; reviewing medical record; tests; labs; and developing future plan of care.    Sandrea Hughs, NP

## 2020-08-02 ENCOUNTER — Other Ambulatory Visit: Payer: Self-pay

## 2020-08-02 ENCOUNTER — Other Ambulatory Visit: Payer: Medicare HMO

## 2020-08-02 DIAGNOSIS — Z7689 Persons encountering health services in other specified circumstances: Secondary | ICD-10-CM

## 2020-08-02 DIAGNOSIS — I10 Essential (primary) hypertension: Secondary | ICD-10-CM

## 2020-08-02 DIAGNOSIS — E538 Deficiency of other specified B group vitamins: Secondary | ICD-10-CM

## 2020-08-02 DIAGNOSIS — M1A00X0 Idiopathic chronic gout, unspecified site, without tophus (tophi): Secondary | ICD-10-CM

## 2020-08-02 DIAGNOSIS — Z1159 Encounter for screening for other viral diseases: Secondary | ICD-10-CM

## 2020-08-02 DIAGNOSIS — M25571 Pain in right ankle and joints of right foot: Secondary | ICD-10-CM

## 2020-08-02 DIAGNOSIS — E559 Vitamin D deficiency, unspecified: Secondary | ICD-10-CM

## 2020-08-02 DIAGNOSIS — N529 Male erectile dysfunction, unspecified: Secondary | ICD-10-CM | POA: Insufficient documentation

## 2020-08-02 DIAGNOSIS — M25471 Effusion, right ankle: Secondary | ICD-10-CM

## 2020-08-02 MED ORDER — PREDNISONE 10 MG PO TABS: ORAL_TABLET | ORAL | 0 refills | Status: DC

## 2020-08-02 NOTE — Telephone Encounter (Signed)
Patient notified and agreed. Medication list updated and Rx sent to pharmacy.   Patient also wants to know if there is anything OTC that he can take to help him sleep. Stated that he gets about 4 hours of sleep and would like to sleep at least 6 hours. Doesn't want a Rx, just a recommendation for something over the counter.   Please Advise.

## 2020-08-02 NOTE — Telephone Encounter (Signed)
Patient notified and agreed.  

## 2020-08-02 NOTE — Telephone Encounter (Signed)
I recommend melatonin 3 mg tablet one by mouth at bedtime as needed.May take 6 mg tablet if still unable to sleep.

## 2020-08-05 ENCOUNTER — Ambulatory Visit: Payer: Self-pay | Admitting: Family

## 2020-08-09 LAB — COMPLETE METABOLIC PANEL WITH GFR
AG Ratio: 0.9 (calc) — ABNORMAL LOW (ref 1.0–2.5)
ALT: 24 U/L (ref 9–46)
AST: 26 U/L (ref 10–35)
Albumin: 3.7 g/dL (ref 3.6–5.1)
Alkaline phosphatase (APISO): 52 U/L (ref 35–144)
BUN: 24 mg/dL (ref 7–25)
CO2: 25 mmol/L (ref 20–32)
Calcium: 9.5 mg/dL (ref 8.6–10.3)
Chloride: 106 mmol/L (ref 98–110)
Creat: 1.19 mg/dL (ref 0.70–1.25)
GFR, Est African American: 73 mL/min/{1.73_m2} (ref >=60)
GFR, Est Non African American: 63 mL/min/{1.73_m2} (ref >=60)
Globulin: 4.3 g/dL (calc) — ABNORMAL HIGH (ref 1.9–3.7)
Glucose, Bld: 72 mg/dL (ref 65–99)
Potassium: 4 mmol/L (ref 3.5–5.3)
Sodium: 142 mmol/L (ref 135–146)
Total Bilirubin: 0.5 mg/dL (ref 0.2–1.2)
Total Protein: 8 g/dL (ref 6.1–8.1)

## 2020-08-09 LAB — CBC WITH DIFFERENTIAL/PLATELET
Absolute Monocytes: 287 cells/uL (ref 200–950)
Basophils Absolute: 42 cells/uL (ref 0–200)
Basophils Relative: 0.6 %
Eosinophils Absolute: 182 cells/uL (ref 15–500)
Eosinophils Relative: 2.6 %
HCT: 33.3 % — ABNORMAL LOW (ref 38.5–50.0)
Hemoglobin: 11 g/dL — ABNORMAL LOW (ref 13.2–17.1)
Lymphs Abs: 2975 cells/uL (ref 850–3900)
MCH: 29.9 pg (ref 27.0–33.0)
MCHC: 33 g/dL (ref 32.0–36.0)
MCV: 90.5 fL (ref 80.0–100.0)
MPV: 9.7 fL (ref 7.5–12.5)
Monocytes Relative: 4.1 %
Neutro Abs: 3514 cells/uL (ref 1500–7800)
Neutrophils Relative %: 50.2 %
Platelets: 424 10*3/uL — ABNORMAL HIGH (ref 140–400)
RBC: 3.68 10*6/uL — ABNORMAL LOW (ref 4.20–5.80)
RDW: 15.3 % — ABNORMAL HIGH (ref 11.0–15.0)
Total Lymphocyte: 42.5 %
WBC: 7 10*3/uL (ref 3.8–10.8)

## 2020-08-09 LAB — LIPID PANEL
Cholesterol: 185 mg/dL (ref <200)
HDL: 31 mg/dL — ABNORMAL LOW (ref >=40)
LDL Cholesterol (Calc): 128 mg/dL (calc) — ABNORMAL HIGH
Non-HDL Cholesterol (Calc): 154 mg/dL (calc) — ABNORMAL HIGH (ref <130)
Total CHOL/HDL Ratio: 6 (calc) — ABNORMAL HIGH (ref <5.0)
Triglycerides: 151 mg/dL — ABNORMAL HIGH (ref <150)

## 2020-08-09 LAB — SEDIMENTATION RATE: Sed Rate: >130 mm/h — ABNORMAL HIGH (ref 0–20)

## 2020-08-09 LAB — TESTOSTERONE TOTAL,FREE,BIO, MALES
Albumin: 3.7 g/dL (ref 3.6–5.1)
Sex Hormone Binding: 28 nmol/L (ref 22–77)
Testosterone, Bioavailable: 67.2 ng/dL — ABNORMAL LOW (ref >=110.0)
Testosterone, Free: 39.3 pg/mL — ABNORMAL LOW (ref 46.0–224.0)
Testosterone: 261 ng/dL (ref 250–827)

## 2020-08-09 LAB — VITAMIN D 1,25 DIHYDROXY
Vitamin D 1, 25 (OH)2 Total: 42 pg/mL (ref 18–72)
Vitamin D2 1, 25 (OH)2: <8 pg/mL
Vitamin D3 1, 25 (OH)2: 42 pg/mL

## 2020-08-09 LAB — HEPATITIS C ANTIBODY
Hepatitis C Ab: NONREACTIVE
SIGNAL TO CUT-OFF: 0.12 (ref <1.00)

## 2020-08-09 LAB — RHEUMATOID FACTOR: Rheumatoid fact SerPl-aCnc: <14 IU/mL (ref <14)

## 2020-08-09 LAB — TSH: TSH: 1.47 mIU/L (ref 0.40–4.50)

## 2020-08-09 LAB — VITAMIN B12: Vitamin B-12: >2000 pg/mL — ABNORMAL HIGH (ref 200–1100)

## 2020-08-09 LAB — C-REACTIVE PROTEIN: CRP: 14.6 mg/L — ABNORMAL HIGH (ref <8.0)

## 2020-08-09 LAB — URIC ACID: Uric Acid, Serum: 9.9 mg/dL — ABNORMAL HIGH (ref 4.0–8.0)

## 2020-08-11 ENCOUNTER — Ambulatory Visit: Payer: Medicare HMO | Admitting: Family

## 2020-08-18 ENCOUNTER — Other Ambulatory Visit: Payer: Self-pay

## 2020-08-18 ENCOUNTER — Ambulatory Visit (INDEPENDENT_AMBULATORY_CARE_PROVIDER_SITE_OTHER): Payer: Medicare HMO | Admitting: Family

## 2020-08-18 ENCOUNTER — Encounter: Payer: Self-pay | Admitting: Family

## 2020-08-18 VITALS — BP 170/70 | HR 74 | Temp 98.2°F | Resp 16 | Ht 70.0 in | Wt 191.2 lb

## 2020-08-18 DIAGNOSIS — I10 Essential (primary) hypertension: Secondary | ICD-10-CM

## 2020-08-18 DIAGNOSIS — M1A00X0 Idiopathic chronic gout, unspecified site, without tophus (tophi): Secondary | ICD-10-CM | POA: Diagnosis not present

## 2020-08-18 MED ORDER — IBUPROFEN 600 MG PO TABS: 600.0000 mg | ORAL_TABLET | Freq: Three times a day (TID) | ORAL | 0 refills | Status: DC | PRN

## 2020-08-18 MED ORDER — METOPROLOL SUCCINATE ER 25 MG PO TB24: 25.0000 mg | ORAL_TABLET | Freq: Every day | ORAL | 0 refills | Status: DC

## 2020-08-18 NOTE — Progress Notes (Signed)
Provider: Richarda Blade FNP-C  Brittiny Levitz, Donalee Citrin, NP  Patient Care Team: Allis Quirarte, Donalee Citrin, NP as PCP - General (Family Medicine)  Extended Emergency Contact Information Primary Emergency Contact: [facility] Address: [address]          [place],  [address] Home Phone: [phone] Relation: None Secondary Emergency Contact: Nicodemus, Denk Home Phone: [phone] Relation: Other  Code Status: Full code  Goals of care: Advanced Directive information Advanced Directives 08/18/2020  Does Patient Have a Medical Advance Directive? No  Would patient like information on creating a medical advance directive? No - Patient declined     Chief Complaint  Patient presents with  . Acute Visit    Complains of pain in knee & ankle    HPI:  Pt is a 70 y.o. male seen today for an acute visit for evaluation of knee and ankle pain.both ankles have been swollen.took tylenol but remembered tylenol is not an antiinflammatory so he took Ibuprofen.pain worst on the knee walking.bilateral ankles painful and swollen.He denies any redness.Has significant medical history of gout.He was here on previous visit with swollen thumb joint treated with prednisone with relief.  Recent lab results reviewed and discussed with patient today.CMA tried to call patient several times to give lab results but did not answer his phone.He states had trouble with trying to log into his MyChart.I've advised him to get our receptionist to assist with resetting MyChart password.   States B/p at home runs in the 120's -140's and higher if stressed.did not bring log to visit today just came from work.He missed his follow up visit for B/p recheck as directed on previous visit.Has been taking amlodipine 5 mg Tablet daily   TRG 151,LDL 128,uric acid 9.9,CRP 63.8,GYK rate > 130 and RF was normal < 14.Testosteron 39.3,biovailable 67.2 WBC were normal and Hgb 11.0 the rest of the lab results were normal.      Past Medical  History:  Diagnosis Date  . Gout   . Hypertension    Past Surgical History:  Procedure Laterality Date  . OTHER SURGICAL HISTORY  1960   Hernia     Allergies  Allergen Reactions  . Shellfish Allergy     Outpatient Encounter Medications as of 08/18/2020  Medication Sig  . amLODipine (NORVASC) 5 MG tablet Take 1 tablet (5 mg total) by mouth daily.  . Cyanocobalamin (VITAMIN B12 PO) Take 1 capsule by mouth daily at 2 am.  . lisinopril (ZESTRIL) 40 MG tablet Take 40 mg by mouth daily.  . melatonin 3 MG TABS tablet Take 3 mg by mouth at bedtime. May take up to 6mg .  . Multiple Vitamin (MULTIVITAMIN ADULT PO) Take 1 capsule by mouth daily.  . predniSONE (DELTASONE) 10 MG tablet Take 4 tablets by mouth day 1, Take 3 tablets day 2, Take 2 tablets day 3, Take one tablet day 4 then stop.  VITAMIN D PO Take 1 capsule by mouth daily.   No facility-administered encounter medications on file as of 08/18/2020.    Review of Systems  Constitutional: Negative for appetite change, chills, fatigue and fever.  Respiratory: Negative for cough, chest tightness, shortness of breath and wheezing.   Cardiovascular: Positive for leg swelling. Negative for chest pain and palpitations.  Musculoskeletal: Positive for arthralgias and joint swelling. Negative for back pain and myalgias.  Skin: Negative for color change, pallor and rash.  Neurological: Negative for dizziness, speech difficulty, weakness, light-headedness, numbness and headaches.    Immunization History  Administered Date(s)  Administered  . Influenza,inj,Quad PF,6+ Mos 04/27/2016, 04/26/2017, 06/19/2018  . Influenza-Unspecified 05/13/2015, 04/26/2019, 04/14/2020  . PFIZER(Purple Top)SARS-COV-2 Vaccination 08/17/2019, 09/07/2019, 04/14/2020  . PPD Test 09/08/2003  . Pneumococcal Polysaccharide-23 04/27/2016  . Td 05/06/2003  . Tdap 05/05/2013, 10/06/2015   Pertinent  Health Maintenance Due  Topic Date Due  . COLONOSCOPY (Pts 45-22yrs  Insurance coverage will need to be confirmed)  Never done  . PNA vac Low Risk Adult (1 of 2 - PCV13) 05/10/2018  . INFLUENZA VACCINE  Completed   Fall Risk  08/18/2020 08/01/2020  Falls in the past year? 0 0  Number falls in past yr: 0 0  Injury with Fall? 0 0   Functional Status Survey:    Vitals:   08/18/20 1518  BP: (!) 170/70  Pulse: 74  Resp: 16  Temp: 98.2 F (36.8 C)  SpO2: 90%  Weight: 191 lb 3.2 oz (86.7 kg)  Height: 5\' 10"  (1.778 m)   Body mass index is 27.43 kg/m. Physical Exam Vitals reviewed.  Constitutional:      General: He is not in acute distress.    Appearance: He is overweight. He is not ill-appearing.  HENT:     Head: Normocephalic.  Cardiovascular:     Rate and Rhythm: Normal rate and regular rhythm.     Pulses: Normal pulses.     Heart sounds: Normal heart sounds. No murmur heard. No friction rub. No gallop.   Pulmonary:     Effort: Pulmonary effort is normal. No respiratory distress.     Breath sounds: Normal breath sounds. No wheezing, rhonchi or rales.  Chest:     Chest wall: No tenderness.  Abdominal:     General: Bowel sounds are normal. There is no distension.     Palpations: Abdomen is soft. There is no mass.     Tenderness: There is no abdominal tenderness. There is no right CVA tenderness, left CVA tenderness, guarding or rebound.  Musculoskeletal:     Right ankle: Swelling present. No ecchymosis. Tenderness present over the medial malleolus. Normal range of motion.     Left ankle: Swelling present. No ecchymosis. Tenderness present over the medial malleolus. Normal range of motion.  Skin:    General: Skin is warm and dry.     Coloration: Skin is not pale.     Findings: No bruising, erythema or rash.  Neurological:     Mental Status: He is alert.     Cranial Nerves: No cranial nerve deficit.     Sensory: No sensory deficit.     Motor: No weakness.     Coordination: Coordination normal.     Gait: Gait normal.     Labs  reviewed: Recent Labs    08/02/20 1621  NA 142  K 4.0  CL 106  CO2 25  GLUCOSE 72  BUN 24  CREATININE 1.19  CALCIUM 9.5   Recent Labs    08/02/20 1621  AST 26  ALT 24  BILITOT 0.5  PROT 8.0   Recent Labs    08/02/20 1621  WBC 7.0  NEUTROABS 3,514  HGB 11.0*  HCT 33.3*  MCV 90.5  PLT 424*   Lab Results  Component Value Date   TSH 1.47 08/02/2020   No results found for: HGBA1C Lab Results  Component Value Date   CHOL 185 08/02/2020   HDL 31 (L) 08/02/2020   LDLCALC 128 (H) 08/02/2020   TRIG 151 (H) 08/02/2020   CHOLHDL 6.0 (H) 08/02/2020  Significant Diagnostic Results in last 30 days:  No results found.  Assessment/Plan 1. Uncontrolled hypertension SBP uncontrolled reports depends on his stress level too. Has new onset of bilateral ankle swelling unclear if related to gout or could be worsened by Amlodipine.will D/c med today then start on Metoprolol side effects discussed.  - advised to continue to monitor B/p and record and bring log to visit - metoprolol succinate (TOPROL-XL) 25 MG 24 hr tablet; Take 1 tablet (25 mg total) by mouth daily.  Dispense: 30 tablet; Refill: 0  2. Idiopathic chronic gout without tophus, unspecified site Bilateral malleolus swollen and slight tender to palpation.No erythema.Has completed previously prescribed prednisone.Will start on Ibuprofen as below for pain and inflammation.Advised to take with food. - encouraged to avoid foods which worsen gout. - additional education material provided on AVS.  - ibuprofen (ADVIL) 600 MG tablet; Take 1 tablet (600 mg total) by mouth every 8 (eight) hours as needed.  Dispense: 30 tablet; Refill: 0  Family/ staff Communication: Reviewed plan of care with patient verbalized understanding   Labs/tests ordered: None   Next Appointment: 1 week for evaluation of Blood pressure and swelling on the legs.  Caesar Bookman, NP

## 2020-08-18 NOTE — Patient Instructions (Addendum)
- Discontinue amlodipine 5 mg tablet  - Start on metoprolol 25 mg tablet one by mouth once daily for high blood pressure  - Take Ibuprofen 600 mg tablet one by mouth every 8 hours as needed for knee and ankle pain   Gout  Gout is painful swelling of your joints. Gout is a type of arthritis. It is caused by having too much uric acid in your body. Uric acid is a chemical that is made when your body breaks down substances called purines. If your body has too much uric acid, sharp crystals can form and build up in your joints. This causes pain and swelling. Gout attacks can happen quickly and be very painful (acute gout). Over time, the attacks can affect more joints and happen more often (chronic gout). What are the causes?  Too much uric acid in your blood. This can happen because: ? Your kidneys do not remove enough uric acid from your blood. ? Your body makes too much uric acid. ? You eat too many foods that are high in purines. These foods include organ meats, some seafood, and beer.  Trauma or stress. What increases the risk?  Having a family history of gout.  Being male and middle-aged.  Being male and having gone through menopause.  Being very overweight (obese).  Drinking alcohol, especially beer.  Not having enough water in the body (being dehydrated).  Losing weight too quickly.  Having an organ transplant.  Having lead poisoning.  Taking certain medicines.  Having kidney disease.  Having a skin condition called psoriasis. What are the signs or symptoms? An attack of acute gout usually happens in just one joint. The most common place is the big toe. Attacks often start at night. Other joints that may be affected include joints of the feet, ankle, knee, fingers, wrist, or elbow. Symptoms of an attack may include:  Very bad pain.  Warmth.  Swelling.  Stiffness.  Shiny, red, or purple skin.  Tenderness. The affected joint may be very painful to  touch.  Chills and fever. Chronic gout may cause symptoms more often. More joints may be involved. You may also have white or yellow lumps (tophi) on your hands or feet or in other areas near your joints.   How is this treated?  Treatment for this condition has two phases: treating an acute attack and preventing future attacks.  Acute gout treatment may include: ? NSAIDs. ? Steroids. These are taken by mouth or injected into a joint. ? Colchicine. This medicine relieves pain and swelling. It can be given by mouth or through an IV tube.  Preventive treatment may include: ? Taking small doses of NSAIDs or colchicine daily. ? Using a medicine that reduces uric acid levels in your blood. ? Making changes to your diet. You may need to see a food expert (dietitian) about what to eat and drink to prevent gout. Follow these instructions at home: During a gout attack  If told, put ice on the painful area: ? Put ice in a plastic bag. ? Place a towel between your skin and the bag. ? Leave the ice on for 20 minutes, 2-3 times a day.  Raise (elevate) the painful joint above the level of your heart as often as you can.  Rest the joint as much as possible. If the joint is in your leg, you may be given crutches.  Follow instructions from your doctor about what you cannot eat or drink.   Avoiding future gout  attacks  Eat a low-purine diet. Avoid foods and drinks such as: ? Liver. ? Kidney. ? Anchovies. ? Asparagus. ? Herring. ? Mushrooms. ? Mussels. ? Beer.  Stay at a healthy weight. If you want to lose weight, talk with your doctor. Do not lose weight too fast.  Start or continue an exercise plan as told by your doctor. Eating and drinking  Drink enough fluids to keep your pee (urine) pale yellow.  If you drink alcohol: ? Limit how much you use to:  0-1 drink a day for women.  0-2 drinks a day for men. ? Be aware of how much alcohol is in your drink. In the [place] one drink  equals one 12 oz bottle of beer (355 mL), one 5 oz glass of wine (148 mL), or one 1 oz glass of hard liquor (44 mL). General instructions  Take over-the-counter and prescription medicines only as told by your doctor.  Do not drive or use heavy machinery while taking prescription pain medicine.  Return to your normal activities as told by your doctor. Ask your doctor what activities are safe for you.  Keep all follow-up visits as told by your doctor. This is important. Contact a doctor if:  You have another gout attack.  You still have symptoms of a gout attack after 10 days of treatment.  You have problems (side effects) because of your medicines.  You have chills or a fever.  You have burning pain when you pee (urinate).  You have pain in your lower back or belly. Get help right away if:  You have very bad pain.  Your pain cannot be controlled.  You cannot pee. Summary  Gout is painful swelling of the joints.  The most common site of pain is the big toe, but it can affect other joints.  Medicines and avoiding some foods can help to prevent and treat gout attacks. This information is not intended to replace advice given to you by your health care provider. Make sure you discuss any questions you have with your health care provider. Document Revised: 01/01/2018 Document Reviewed: 01/01/2018 Elsevier Patient Education  2021 [employer].

## 2020-08-26 ENCOUNTER — Other Ambulatory Visit: Payer: Self-pay

## 2020-08-26 ENCOUNTER — Telehealth: Payer: Self-pay

## 2020-08-26 ENCOUNTER — Encounter: Payer: Self-pay | Admitting: Family

## 2020-08-26 ENCOUNTER — Ambulatory Visit (INDEPENDENT_AMBULATORY_CARE_PROVIDER_SITE_OTHER): Payer: Medicare HMO | Admitting: Family

## 2020-08-26 VITALS — BP 150/80 | HR 86 | Temp 97.5°F | Resp 16 | Ht 70.0 in | Wt 194.0 lb

## 2020-08-26 DIAGNOSIS — R399 Unspecified symptoms and signs involving the genitourinary system: Secondary | ICD-10-CM

## 2020-08-26 DIAGNOSIS — R413 Other amnesia: Secondary | ICD-10-CM | POA: Diagnosis not present

## 2020-08-26 LAB — POCT URINALYSIS DIPSTICK
Bilirubin, UA: NEGATIVE
Glucose, UA: NEGATIVE
Nitrite, UA: NEGATIVE
Protein, UA: NEGATIVE
Spec Grav, UA: 1.015 (ref 1.010–1.025)
Urobilinogen, UA: NEGATIVE E.U./dL — AB
pH, UA: 5 (ref 5.0–8.0)

## 2020-08-26 NOTE — Telephone Encounter (Signed)
Patients wife is aware of Dinah's response. I offered earlier appointment again and Mrs.Sullenger will check with her husband and call back if he is willing to be seen sooner than 3:45 pm. Mrs.Bells is aware that we can provide her husband with a work note, if needed.

## 2020-08-26 NOTE — Telephone Encounter (Signed)
Patient's wife called requesting to speak with Dinah urgently as she is very concerned about her husband who is currently at work.  Patient woke up today very disoriented, did not know what time he had to be at work and was unable to remember the code to the building at work. Patient drove himself to work and Mrs.Bridgett drove behind him.   Patient is already on Dinah's schedule today at 3:45 pm for a follow-up, I offered earlier appointment at 10:30 and Mrs. Delorenzo said she does not know if he will be able to get off and would like to speak to [facility] before doing anything.

## 2020-08-26 NOTE — Telephone Encounter (Signed)
Please ask wife to accompany patient to visit today to discuss concerns.

## 2020-08-26 NOTE — Progress Notes (Signed)
Provider: Richarda Blade FNP-C  Khaden Gater, Donalee Citrin, NP  Patient Care Team: Amarra Sawyer, Donalee Citrin, NP as PCP - General (Family Medicine)  Extended Emergency Contact Information Primary Emergency Contact: [facility] Address: [address]          [place],  [address] Home Phone: [phone] Mobile Phone: [phone] Relation: None Secondary Emergency Contact: Abbott, Jasinski Home Phone: [phone] Relation: Other  Code Status:  Full Code  Goals of care: Advanced Directive information Advanced Directives 08/26/2020  Does Patient Have a Medical Advance Directive? Yes  Type of Advance Directive Out of facility DNR (pink MOST or yellow form)  Does patient want to make changes to medical advance directive? No - Patient declined  Would patient like information on creating a medical advance directive? -     Chief Complaint  Patient presents with  . Follow-up    1 week follow up.    HPI:  Pt is a 70 y.o. male seen today for an acute visit for one week follow up on high blood pressure.He is here with wife today. Wife states patient woke up this morning was very disoriented.usually leaves for work around 6: 30 am but was saying reporting at 8 am untested of usual 7 pm.He was forgetful did know where he was or time.He could not remember his cell phone code or the code to get to work.He drove himself to work but wife followed behind him. 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 He brought his blood pressure readings to visit readings in the 110's/70's - 150's/80's with few readings systolic above 160's. He states forgot to take his medication today.B/p on arrival was 160/80 had his blood pressure medication took metoprolol during visit rechecked B/p trending down.   Past Medical History:  Diagnosis Date  . Gout   . Hypertension    Past Surgical History:  Procedure Laterality Date  . OTHER SURGICAL HISTORY  1960    Hernia     Allergies  Allergen Reactions  . Shellfish Allergy     Outpatient Encounter Medications as of 08/26/2020  Medication Sig  . Cyanocobalamin (VITAMIN B12 PO) Take 1 capsule by mouth daily at 2 am.  . ibuprofen (ADVIL) 600 MG tablet Take 1 tablet (600 mg total) by mouth every 8 (eight) hours as needed.  Marland Kitchen lisinopril (ZESTRIL) 40 MG tablet Take 40 mg by mouth daily.  . melatonin 3 MG TABS tablet Take 3 mg by mouth at bedtime. May take up to 6mg .  . metoprolol succinate (TOPROL-XL) 25 MG 24 hr tablet Take 1 tablet (25 mg total) by mouth daily.  . Multiple Vitamin (MULTIVITAMIN ADULT PO) Take 1 capsule by mouth daily.  VITAMIN D PO Take 1 capsule by mouth daily.  . [DISCONTINUED] predniSONE (DELTASONE) 10 MG tablet Take 4 tablets by mouth day 1, Take 3 tablets day 2, Take 2 tablets day 3, Take one tablet day 4 then stop. (Patient not taking: Reported on 08/18/2020)   No facility-administered encounter medications on file as of 08/26/2020.    Review of Systems  Constitutional: Negative for appetite change, chills, fatigue and fever.  HENT: Negative for congestion, rhinorrhea, sinus pressure, sinus pain, sneezing and sore throat.   Eyes: Negative for pain, discharge, redness, itching and visual disturbance.  Respiratory: Negative for cough, chest tightness, shortness of breath and wheezing.   Cardiovascular: Negative for chest pain, palpitations and leg swelling.  Gastrointestinal: Negative for abdominal distention,  abdominal pain, constipation, diarrhea, nausea and vomiting.  Genitourinary: Positive for frequency. Negative for difficulty urinating, dysuria, flank pain, hematuria and urgency.  Musculoskeletal: Negative for arthralgias, gait problem, joint swelling and myalgias.  Skin: Negative for color change, pallor and rash.  Neurological: Negative for dizziness, speech difficulty, weakness, light-headedness and headaches.  Hematological: Does not bruise/bleed easily.   Psychiatric/Behavioral: Positive for confusion. Negative for agitation, behavioral problems, sleep disturbance and suicidal ideas. The patient is nervous/anxious.     Immunization History  Administered Date(s) Administered  . Influenza,inj,Quad PF,6+ Mos 04/27/2016, 04/26/2017, 06/19/2018  . Influenza-Unspecified 05/13/2015, 04/26/2019, 04/14/2020  . PFIZER(Purple Top)SARS-COV-2 Vaccination 08/17/2019, 09/07/2019, 04/14/2020  . PPD Test 09/08/2003  . Pneumococcal Polysaccharide-23 04/27/2016  . Td 05/06/2003  . Tdap 05/05/2013, 10/06/2015   Pertinent  Health Maintenance Due  Topic Date Due  . COLONOSCOPY (Pts 45-66yrs Insurance coverage will need to be confirmed)  Never done  . PNA vac Low Risk Adult (1 of 2 - PCV13) 05/10/2018  . INFLUENZA VACCINE  Completed   Fall Risk  08/26/2020 08/18/2020 08/01/2020  Falls in the past year? 0 0 0  Number falls in past yr: 0 0 0  Injury with Fall? 0 0 0   Functional Status Survey:    Vitals:   08/26/20 1542  BP: (!) 160/80  Pulse: 86  Resp: 16  Temp: (!) 97.5 F (36.4 C)  SpO2: 98%  Weight: 194 lb (88 kg)  Height: 5\' 10"  (1.778 m)   Body mass index is 27.84 kg/m. Physical Exam Vitals reviewed.  Constitutional:      General: He is not in acute distress.    Appearance: He is overweight. He is not ill-appearing.  HENT:     Head: Normocephalic.     Right Ear: Tympanic membrane, ear canal and external ear normal. There is no impacted cerumen.     Left Ear: Tympanic membrane, ear canal and external ear normal. There is no impacted cerumen.     Nose: Nose normal. No congestion or rhinorrhea.     Mouth/Throat:     Mouth: Mucous membranes are moist.     Pharynx: Oropharynx is clear. No oropharyngeal exudate or posterior oropharyngeal erythema.  Eyes:     General: No scleral icterus.       Right eye: No discharge.        Left eye: No discharge.     Extraocular Movements: Extraocular movements intact.     Conjunctiva/sclera:  Conjunctivae normal.     Pupils: Pupils are equal, round, and reactive to light.  Neck:     Vascular: No carotid bruit.  Cardiovascular:     Rate and Rhythm: Normal rate and regular rhythm.     Pulses: Normal pulses.     Heart sounds: Normal heart sounds. No murmur heard. No friction rub. No gallop.   Pulmonary:     Effort: Pulmonary effort is normal. No respiratory distress.     Breath sounds: Normal breath sounds. No wheezing, rhonchi or rales.  Chest:     Chest wall: No tenderness.  Abdominal:     General: Bowel sounds are normal. There is no distension.     Palpations: Abdomen is soft. There is no mass.     Tenderness: There is no abdominal tenderness. There is no right CVA tenderness, guarding or rebound.  Musculoskeletal:        General: No swelling or tenderness. Normal range of motion.     Cervical back: Normal range of motion. No rigidity  or tenderness.     Right lower leg: No edema.     Left lower leg: No edema.  Lymphadenopathy:     Cervical: No cervical adenopathy.  Skin:    General: Skin is warm and dry.     Coloration: Skin is not pale.     Findings: No bruising, erythema or rash.  Neurological:     Mental Status: He is alert and oriented to person, place, and time.     Cranial Nerves: No cranial nerve deficit.     Sensory: No sensory deficit.     Motor: No weakness.     Coordination: Coordination normal.     Gait: Gait normal.  Psychiatric:        Mood and Affect: Mood is anxious.        Speech: Speech normal.        Behavior: Behavior normal.        Judgment: Judgment normal.     Comments: Fearful that something can wrong at work working " very important people and crucial"      Labs reviewed: Recent Labs    08/02/20 1621  NA 142  K 4.0  CL 106  CO2 25  GLUCOSE 72  BUN 24  CREATININE 1.19  CALCIUM 9.5   Recent Labs    08/02/20 1621  AST 26  ALT 24  BILITOT 0.5  PROT 8.0   Recent Labs    08/02/20 1621  WBC 7.0  NEUTROABS 3,514  HGB  11.0*  HCT 33.3*  MCV 90.5  PLT 424*   Lab Results  Component Value Date   TSH 1.47 08/02/2020   No results found for: HGBA1C Lab Results  Component Value Date   CHOL 185 08/02/2020   HDL 31 (L) 08/02/2020   LDLCALC 128 (H) 08/02/2020   TRIG 151 (H) 08/02/2020   CHOLHDL 6.0 (H) 08/02/2020    Significant Diagnostic Results in last 30 days:  No results found.  Assessment/Plan  1. Symptoms of urinary tract infection Afebrile. Urine frequency and disorientation this morning reported - POC Urinalysis Dipstick indicates yellow cloudy urine with large blood and small leukocytes but negative for nitrites.will send urine for culture made aware that culture results will be back in 3 days then will call with results.  - notify provider if symptoms worsen or fail to improve.  - Culture, Urine  2. Memory loss Unclear why he was disoriented and confused this morning before heading to work.unknown cause of disorientation.High stress level at work could be a contributory factor. Neuro exam intact.will obtain head CT scan  - CT head Wo Contrast; Future  Family/ staff Communication: Reviewed plan of care with patient and wife   Labs/tests ordered: - CT head Wo Contrast; Future  Next Appointment: As needed if symptoms worsen or fail to improve   Caesar Bookman, NP

## 2020-08-26 NOTE — Patient Instructions (Signed)
-   Please get CT scan of the head at [facility]  imaging at [address]  - Notify provider if B/p > 140/90 or symptoms worsen

## 2020-08-28 LAB — URINE CULTURE
MICRO NUMBER:: 11613103
Result:: NO GROWTH
SPECIMEN QUALITY:: ADEQUATE

## 2020-09-09 ENCOUNTER — Other Ambulatory Visit: Payer: Self-pay | Admitting: Family

## 2020-09-09 DIAGNOSIS — I10 Essential (primary) hypertension: Secondary | ICD-10-CM

## 2020-09-09 NOTE — Telephone Encounter (Signed)
Patient is requesting refill on medication "Metoprolol 25mg ". Patient last refill was 08/18/2020 . Patient was given 30 pills with 0 refills. Is this medication long term? Medication pend and sent to PCP Ngetich, 08/20/2020, NP .

## 2020-09-16 ENCOUNTER — Ambulatory Visit (INDEPENDENT_AMBULATORY_CARE_PROVIDER_SITE_OTHER): Payer: Medicare HMO | Admitting: Family

## 2020-09-16 ENCOUNTER — Other Ambulatory Visit: Payer: Self-pay

## 2020-09-16 ENCOUNTER — Encounter: Payer: Self-pay | Admitting: Family

## 2020-09-16 VITALS — BP 184/92 | HR 89 | Temp 97.5°F | Resp 16 | Ht 70.0 in | Wt 198.2 lb

## 2020-09-16 DIAGNOSIS — M1A00X0 Idiopathic chronic gout, unspecified site, without tophus (tophi): Secondary | ICD-10-CM | POA: Diagnosis not present

## 2020-09-16 DIAGNOSIS — M25432 Effusion, left wrist: Secondary | ICD-10-CM

## 2020-09-16 DIAGNOSIS — M25561 Pain in right knee: Secondary | ICD-10-CM

## 2020-09-16 DIAGNOSIS — I10 Essential (primary) hypertension: Secondary | ICD-10-CM | POA: Diagnosis not present

## 2020-09-16 DIAGNOSIS — M25461 Effusion, right knee: Secondary | ICD-10-CM

## 2020-09-16 DIAGNOSIS — M25532 Pain in left wrist: Secondary | ICD-10-CM

## 2020-09-16 DIAGNOSIS — M7989 Other specified soft tissue disorders: Secondary | ICD-10-CM

## 2020-09-16 MED ORDER — CLONIDINE HCL 0.1 MG PO TABS
0.1000 mg | ORAL_TABLET | Freq: Once | ORAL | Status: AC
  Administered 2020-09-16: 0.1 mg via ORAL

## 2020-09-16 MED ORDER — PREDNISONE 10 MG PO TABS: ORAL_TABLET | ORAL | 0 refills | Status: AC

## 2020-09-16 MED ORDER — HYDRALAZINE HCL 10 MG PO TABS: 10.0000 mg | ORAL_TABLET | Freq: Three times a day (TID) | ORAL | 0 refills | Status: DC

## 2020-09-16 NOTE — Patient Instructions (Addendum)
-   Please get X-ray done at [address] [facility] imaging then will call you with results. - Take Hydralazine 10 mg tablet one by mouth three times daily as directed - continue to apply ice to swollen hand  - Avoid lifting heavy load until swelling resolved  - Take prednisone as directed

## 2020-09-16 NOTE — Progress Notes (Signed)
Provider: Richarda Blade FNP-C  Eulan Heyward, Donalee Citrin, NP  Patient Care Team: Davide Risdon, Donalee Citrin, NP as PCP - General (Family Medicine)  Extended Emergency Contact Information Primary Emergency Contact: [facility] Address: [address]          [place],  [address] Home Phone: [phone] Mobile Phone: [phone] Relation: None Secondary Emergency Contact: Rowland, Ericsson Home Phone: [phone] Relation: Other  Code Status:  DNR Goals of care: Advanced Directive information Advanced Directives 09/16/2020  Does Patient Have a Medical Advance Directive? Yes  Type of Advance Directive Out of facility DNR (pink MOST or yellow form)  Does patient want to make changes to medical advance directive? No - Patient declined  Would patient like information on creating a medical advance directive? -     Chief Complaint  Patient presents with  . Acute Visit    Complains of left hand swelling    HPI:  Pt is a 70 y.o. male seen today for an acute visit for follow up high blood pressure and left hand swelling.States was lifting trash at work on 09/09/2020 started swelling over the weekend.the whole wrist and hand was red,swollen and warm .Used ice with little improvement.Has also been wearing a wrist brace. Right knee was also tender but not swollen.   His brother passed away 10/09/19.still dealing with work and family stress.  His home B/p 140/80's - 170's/100    Past Medical History:  Diagnosis Date  . Gout   . Hypertension    Past Surgical History:  Procedure Laterality Date  . OTHER SURGICAL HISTORY  1960   Hernia     Allergies  Allergen Reactions  . Shellfish Allergy     Outpatient Encounter Medications as of 09/16/2020  Medication Sig  . Cyanocobalamin (VITAMIN B12 PO) Take 1 capsule by mouth daily at 2 am.  . ibuprofen (ADVIL) 600 MG tablet Take 1 tablet (600 mg total) by mouth every 8 (eight) hours as needed.  Marland Kitchen lisinopril (ZESTRIL) 40 MG tablet Take 40 mg  by mouth daily.  . melatonin 3 MG TABS tablet Take 3 mg by mouth at bedtime. May take up to 6mg .  . metoprolol succinate (TOPROL-XL) 25 MG 24 hr tablet TAKE 1 TABLET (25 MG TOTAL) BY MOUTH DAILY.  . Multiple Vitamin (MULTIVITAMIN ADULT PO) Take 1 capsule by mouth daily.  VITAMIN D PO Take 1 capsule by mouth daily.   No facility-administered encounter medications on file as of 09/16/2020.    Review of Systems  Constitutional: Negative for appetite change, chills, fatigue and fever.  Respiratory: Negative for cough, chest tightness, shortness of breath and wheezing.   Cardiovascular: Negative for chest pain, palpitations and leg swelling.  Gastrointestinal: Negative for abdominal distention, abdominal pain, constipation, diarrhea, nausea and vomiting.  Musculoskeletal: Positive for arthralgias and joint swelling. Negative for back pain, gait problem, myalgias, neck pain and neck stiffness.  Skin: Negative for color change, pallor and rash.  Neurological: Negative for dizziness, seizures, speech difficulty, weakness, light-headedness, numbness and headaches.  Hematological: Does not bruise/bleed easily.    Immunization History  Administered Date(s) Administered  . Influenza,inj,Quad PF,6+ Mos 04/27/2016, 04/26/2017, 06/19/2018  . Influenza-Unspecified 05/13/2015, 04/26/2019, 04/14/2020  . PFIZER(Purple Top)SARS-COV-2 Vaccination 08/17/2019, 09/07/2019, 04/14/2020  . PPD Test 09/08/2003  . Pneumococcal Polysaccharide-23 04/27/2016  . Td 05/06/2003  . Tdap 05/05/2013, 10/06/2015   Pertinent  Health Maintenance Due  Topic Date Due  . COLONOSCOPY (Pts 45-42yrs Insurance coverage will need to be confirmed)  Never done  .  PNA vac Low Risk Adult (1 of 2 - PCV13) 05/10/2018  . INFLUENZA VACCINE  Completed   Fall Risk  09/16/2020 08/26/2020 08/18/2020 08/01/2020  Falls in the past year? 0 0 0 0  Number falls in past yr: 0 0 0 0  Injury with Fall? 0 0 0 0   Functional Status Survey:     Vitals:   09/16/20 1523  BP: (!) 184/92  Pulse: 89  Resp: 16  Temp: (!) 97.5 F (36.4 C)  SpO2: 98%  Weight: 198 lb 3.2 oz (89.9 kg)  Height: 5\' 10"  (1.778 m)   Body mass index is 28.44 kg/m. Physical Exam Vitals reviewed.  Constitutional:      General: He is not in acute distress.    Appearance: He is overweight. He is not ill-appearing.  HENT:     Head: Normocephalic.  Eyes:     General: No scleral icterus.       Right eye: No discharge.        Left eye: No discharge.     Extraocular Movements: Extraocular movements intact.     Conjunctiva/sclera: Conjunctivae normal.     Pupils: Pupils are equal, round, and reactive to light.  Neck:     Vascular: No carotid bruit.  Cardiovascular:     Rate and Rhythm: Normal rate and regular rhythm.     Pulses: Normal pulses.     Heart sounds: Normal heart sounds. No murmur heard. No friction rub. No gallop.   Pulmonary:     Effort: Pulmonary effort is normal. No respiratory distress.     Breath sounds: Normal breath sounds. No wheezing, rhonchi or rales.  Chest:     Chest wall: No tenderness.  Abdominal:     General: Bowel sounds are normal. There is no distension.     Palpations: Abdomen is soft. There is no mass.     Tenderness: There is no abdominal tenderness. There is no left CVA tenderness, guarding or rebound.  Musculoskeletal:     Right wrist: Normal.     Left wrist: Swelling and tenderness present. No effusion, bony tenderness or crepitus. Decreased range of motion. Normal pulse.     Right hand: Normal.     Left hand: Tenderness present. No swelling. Decreased range of motion. Normal strength. Normal sensation. Normal capillary refill. Normal pulse.     Cervical back: Normal range of motion. No rigidity or tenderness.     Right lower leg: No edema.     Left lower leg: No edema.  Lymphadenopathy:     Cervical: No cervical adenopathy.  Skin:    General: Skin is warm and dry.     Coloration: Skin is not pale.      Findings: No bruising or rash.     Comments: Left hand erythema,swollen,warm to touch and tender to palpation   Neurological:     Mental Status: He is alert and oriented to person, place, and time.     Cranial Nerves: No cranial nerve deficit.     Sensory: No sensory deficit.     Motor: No weakness.     Gait: Gait normal.  Psychiatric:        Mood and Affect: Mood normal.        Behavior: Behavior normal.        Thought Content: Thought content normal.        Judgment: Judgment normal.     Labs reviewed: Recent Labs    08/02/20 1621  NA 142  K 4.0  CL 106  CO2 25  GLUCOSE 72  BUN 24  CREATININE 1.19  CALCIUM 9.5   Recent Labs    08/02/20 1621  AST 26  ALT 24  BILITOT 0.5  PROT 8.0   Recent Labs    08/02/20 1621  WBC 7.0  NEUTROABS 3,514  HGB 11.0*  HCT 33.3*  MCV 90.5  PLT 424*   Lab Results  Component Value Date   TSH 1.47 08/02/2020   No results found for: HGBA1C Lab Results  Component Value Date   CHOL 185 08/02/2020   HDL 31 (L) 08/02/2020   LDLCALC 128 (H) 08/02/2020   TRIG 151 (H) 08/02/2020   CHOLHDL 6.0 (H) 08/02/2020    Significant Diagnostic Results in last 30 days:  No results found.  Assessment/Plan  1. Uncontrolled hypertension B/p elevated this visit though in pain due to left hand swelling and redness.  Clonidine administered by CMA with much improvement.  - continue on lisinopril and metoprolol  - will add hydralazine 10 mg tablet one by mouth three times daily  - Advised to check Blood pressure at home and record on log provided and notify provider if B/p > 140/90  - Dietary modification and exercise at least 3 times per week for 30 minutes advised. - additional DASH Eating plan Education information provided on AVS  - cloNIDine (CATAPRES) tablet 0.1 mg  2. Idiopathic chronic gout without tophus, unspecified site Afebrile. Left hand and wrist erythema,swelling,warm to touch and tender to palpation.Limited ROM due to  pain.suspect gout tends to have flare up though has decline restarting allopurinol to prevent flare ups.used to be on allopurinol in the past but unclear why he stopped. Will obtain lab work and start on tapered prednisone as below.has take Prednisone aware of side effects.  - CBC with Differential/Platelet - Uric Acid - C-reactive protein - Sedimentation rate - predniSONE (DELTASONE) 10 MG tablet; Take 4 tablets (40 mg total) by mouth daily with breakfast for 1 day, THEN 3 tablets (30 mg total) daily with breakfast for 1 day, THEN 2 tablets (20 mg total) daily with breakfast for 1 day, THEN 1 tablet (10 mg total) daily with breakfast for 1 day, THEN 0.5 tablets (5 mg total) daily with breakfast for 1 day.  Dispense: 10.5 tablet; Refill: 0  3. Pain and swelling of wrist, left Will obtain imaging and labs as below.  - CBC with Differential/Platelet - Uric Acid - C-reactive protein - Sedimentation rate - DG Wrist Complete Left; Future - DG Hand Complete Left; Future - Please get X-ray done at [facility] imaging then will call you with results. - continue to apply ice to swollen hand  - Avoid lifting heavy load until swelling resolved   4. Pain and swelling of right knee Afebrile.Knee swollen slightly red and tender to palpation. - CBC with Differential/Platelet - Uric Acid - C-reactive protein - Sedimentation rate - DG Knee Complete 4 Views Right; Future  5. Swelling of left hand Afebrile.exam as above.  - continue to apply ice to swollen hand  - Avoid lifting heavy load until swelling resolved  - DG Hand Complete Left; Future  Family/ staff Communication: Reviewed plan of care with patient verbalized understanding.   Labs/tests ordered:  - CBC with Differential/Platelet - Uric Acid - C-reactive protein - Sedimentation rate - DG Knee Complete 4 Views Right; Future - DG Hand Complete Left; Future - DG Wrist Complete Left; Future   Next  Appointment: 1 week for high Blood pressure and left hand and knee swelling evaluation   Caesar Bookmaninah C Hoyt Leanos, NP

## 2020-09-17 LAB — CBC WITH DIFFERENTIAL/PLATELET
Absolute Monocytes: 449 cells/uL (ref 200–950)
Basophils Absolute: 33 cells/uL (ref 0–200)
Basophils Relative: 0.5 %
Eosinophils Absolute: 119 cells/uL (ref 15–500)
Eosinophils Relative: 1.8 %
HCT: 35 % — ABNORMAL LOW (ref 38.5–50.0)
Hemoglobin: 11.7 g/dL — ABNORMAL LOW (ref 13.2–17.1)
Lymphs Abs: 2191 cells/uL (ref 850–3900)
MCH: 31 pg (ref 27.0–33.0)
MCHC: 33.4 g/dL (ref 32.0–36.0)
MCV: 92.6 fL (ref 80.0–100.0)
MPV: 10.6 fL (ref 7.5–12.5)
Monocytes Relative: 6.8 %
Neutro Abs: 3808 cells/uL (ref 1500–7800)
Neutrophils Relative %: 57.7 %
Platelets: 258 10*3/uL (ref 140–400)
RBC: 3.78 10*6/uL — ABNORMAL LOW (ref 4.20–5.80)
RDW: 17.8 % — ABNORMAL HIGH (ref 11.0–15.0)
Total Lymphocyte: 33.2 %
WBC: 6.6 10*3/uL (ref 3.8–10.8)

## 2020-09-17 LAB — C-REACTIVE PROTEIN: CRP: 62.9 mg/L — ABNORMAL HIGH (ref <8.0)

## 2020-09-17 LAB — URIC ACID: Uric Acid, Serum: 9.5 mg/dL — ABNORMAL HIGH (ref 4.0–8.0)

## 2020-09-17 LAB — SEDIMENTATION RATE: Sed Rate: >130 mm/h — ABNORMAL HIGH (ref 0–20)

## 2020-09-23 ENCOUNTER — Ambulatory Visit (INDEPENDENT_AMBULATORY_CARE_PROVIDER_SITE_OTHER): Payer: Medicare HMO | Admitting: Family

## 2020-09-23 ENCOUNTER — Other Ambulatory Visit: Payer: Self-pay

## 2020-09-23 ENCOUNTER — Encounter: Payer: Self-pay | Admitting: Family

## 2020-09-23 VITALS — BP 160/90 | HR 78 | Temp 97.5°F | Resp 16 | Ht 70.0 in | Wt 198.4 lb

## 2020-09-23 DIAGNOSIS — I10 Essential (primary) hypertension: Secondary | ICD-10-CM | POA: Diagnosis not present

## 2020-09-23 DIAGNOSIS — M7989 Other specified soft tissue disorders: Secondary | ICD-10-CM | POA: Diagnosis not present

## 2020-09-23 MED ORDER — HYDRALAZINE HCL 25 MG PO TABS: 25.0000 mg | ORAL_TABLET | Freq: Three times a day (TID) | ORAL | 0 refills | Status: DC

## 2020-09-23 NOTE — Progress Notes (Signed)
Provider: Richarda Blade FNP-C  Qianna Clagett, Donalee Citrin, NP  Patient Care Team: Zakariah Dejarnette, Donalee Citrin, NP as PCP - General (Family Medicine)  Extended Emergency Contact Information Primary Emergency Contact: [facility] Address: [address]          [place],  [address] Home Phone: [phone] Mobile Phone: [phone] Relation: None Secondary Emergency Contact: Elnathan, Fulford Home Phone: [phone] Relation: Other  Code Status:  Full Code  Goals of care: Advanced Directive information Advanced Directives 09/23/2020  Does Patient Have a Medical Advance Directive? Yes  Type of Advance Directive Out of facility DNR (pink MOST or yellow form)  Does patient want to make changes to medical advance directive? No - Patient declined  Would patient like information on creating a medical advance directive? -     Chief Complaint  Patient presents with  . Follow-up    1 week follow up.    HPI:  Pt is a 70 y.o. male seen today for an acute visit for follow up left hand/left knee  swelling and pain.States completed prednisone and swelling and pain on left hand and wrist has resolved.still wearing hand brace during lifting trash at work.lab work done indicated gout.given his resolved symptoms recommended allopurinol but declines. B/p today high but states normal at home.  B/p at home in the 130's/70's.He states stress level has gone down.states wife has gotten a job so helping not stressed any more.    Past Medical History:  Diagnosis Date  . Gout   . Hypertension    Past Surgical History:  Procedure Laterality Date  . OTHER SURGICAL HISTORY  1960   Hernia     Allergies  Allergen Reactions  . Shellfish Allergy     Outpatient Encounter Medications as of 09/23/2020  Medication Sig  . Cyanocobalamin (VITAMIN B12 PO) Take 1 capsule by mouth daily at 2 am.  . hydrALAZINE (APRESOLINE) 10 MG tablet Take 1 tablet (10 mg total) by mouth 3 (three) times daily.  Marland Kitchen ibuprofen (ADVIL)  600 MG tablet Take 1 tablet (600 mg total) by mouth every 8 (eight) hours as needed.  Marland Kitchen lisinopril (ZESTRIL) 40 MG tablet Take 40 mg by mouth daily.  . melatonin 3 MG TABS tablet Take 3 mg by mouth at bedtime. May take up to 6mg .  . metoprolol succinate (TOPROL-XL) 25 MG 24 hr tablet TAKE 1 TABLET (25 MG TOTAL) BY MOUTH DAILY.  . Multiple Vitamin (MULTIVITAMIN ADULT PO) Take 1 capsule by mouth daily.  VITAMIN D PO Take 1 capsule by mouth daily.   No facility-administered encounter medications on file as of 09/23/2020.    Review of Systems  Constitutional: Negative for appetite change, chills, fatigue and fever.  Eyes: Negative for discharge, redness, itching and visual disturbance.  Respiratory: Negative for cough, chest tightness, shortness of breath and wheezing.   Cardiovascular: Negative for chest pain, palpitations and leg swelling.  Gastrointestinal: Negative for abdominal distention, abdominal pain, constipation, diarrhea, nausea and vomiting.  Musculoskeletal: Negative for arthralgias, back pain, gait problem, joint swelling, myalgias and neck pain.  Skin: Negative for color change, pallor and rash.  Neurological: Negative for dizziness, speech difficulty, weakness, light-headedness, numbness and headaches.  Hematological: Does not bruise/bleed easily.    Immunization History  Administered Date(s) Administered  . Influenza,inj,Quad PF,6+ Mos 04/27/2016, 04/26/2017, 06/19/2018  . Influenza-Unspecified 05/13/2015, 04/26/2019, 04/14/2020  . PFIZER(Purple Top)SARS-COV-2 Vaccination 08/17/2019, 09/07/2019, 04/14/2020  . PPD Test 09/08/2003  . Pneumococcal Polysaccharide-23 04/27/2016  . Td 05/06/2003  . Tdap 05/05/2013, 10/06/2015  Pertinent  Health Maintenance Due  Topic Date Due  . COLONOSCOPY (Pts 45-75yrs Insurance coverage will need to be confirmed)  Never done  . PNA vac Low Risk Adult (1 of 2 - PCV13) 05/10/2018  . INFLUENZA VACCINE  01/23/2021   Fall Risk  09/23/2020  09/16/2020 08/26/2020 08/18/2020 08/01/2020  Falls in the past year? 0 0 0 0 0  Number falls in past yr: 0 0 0 0 0  Injury with Fall? 0 0 0 0 0   Functional Status Survey:    Vitals:   09/23/20 1527  BP: (!) 160/90  Pulse: 78  Resp: 16  Temp: (!) 97.5 F (36.4 C)  SpO2: 98%  Weight: 198 lb 6.4 oz (90 kg)  Height: 5\' 10"  (1.778 m)   Body mass index is 28.47 kg/m. Physical Exam Vitals reviewed.  Constitutional:      General: He is not in acute distress.    Appearance: He is overweight. He is not ill-appearing.  HENT:     Head: Normocephalic.     Nose: Nose normal. No congestion or rhinorrhea.     Mouth/Throat:     Mouth: Mucous membranes are moist.     Pharynx: Oropharynx is clear. No oropharyngeal exudate or posterior oropharyngeal erythema.  Eyes:     General: No scleral icterus.       Right eye: No discharge.        Left eye: No discharge.     Extraocular Movements: Extraocular movements intact.     Conjunctiva/sclera: Conjunctivae normal.     Pupils: Pupils are equal, round, and reactive to light.  Neck:     Vascular: No carotid bruit.  Cardiovascular:     Rate and Rhythm: Normal rate and regular rhythm.     Pulses: Normal pulses.     Heart sounds: Normal heart sounds. No murmur heard. No friction rub. No gallop.   Pulmonary:     Effort: Pulmonary effort is normal. No respiratory distress.     Breath sounds: Normal breath sounds. No wheezing, rhonchi or rales.  Chest:     Chest wall: No tenderness.  Abdominal:     General: Bowel sounds are normal. There is no distension.     Palpations: Abdomen is soft. There is no mass.     Tenderness: There is no abdominal tenderness. There is no right CVA tenderness, left CVA tenderness, guarding or rebound.  Musculoskeletal:        General: No swelling or tenderness. Normal range of motion.     Cervical back: Normal range of motion. No rigidity or tenderness.     Right lower leg: No edema.     Left lower leg: No edema.   Lymphadenopathy:     Cervical: No cervical adenopathy.  Skin:    General: Skin is warm and dry.     Coloration: Skin is not pale.     Findings: No bruising, erythema or rash.  Neurological:     Mental Status: He is alert and oriented to person, place, and time.     Cranial Nerves: No cranial nerve deficit.     Motor: No weakness.     Gait: Gait normal.  Psychiatric:        Mood and Affect: Mood normal.        Behavior: Behavior normal.        Thought Content: Thought content normal.        Judgment: Judgment normal.     Labs reviewed: Recent Labs  08/02/20 1621  NA 142  K 4.0  CL 106  CO2 25  GLUCOSE 72  BUN 24  CREATININE 1.19  CALCIUM 9.5   Recent Labs    08/02/20 1621  AST 26  ALT 24  BILITOT 0.5  PROT 8.0   Recent Labs    08/02/20 1621 09/16/20 1614  WBC 7.0 6.6  NEUTROABS 3,514 3,808  HGB 11.0* 11.7*  HCT 33.3* 35.0*  MCV 90.5 92.6  PLT 424* 258   Lab Results  Component Value Date   TSH 1.47 08/02/2020   No results found for: HGBA1C Lab Results  Component Value Date   CHOL 185 08/02/2020   HDL 31 (L) 08/02/2020   LDLCALC 128 (H) 08/02/2020   TRIG 151 (H) 08/02/2020   CHOLHDL 6.0 (H) 08/02/2020    Significant Diagnostic Results in last 30 days:  No results found.  Assessment/Plan 1. Uncontrolled hypertension Home blood pressure has improved.b/p elevated today. Continue on metoprolol and Lisinopril  - Advised to increase Hydralazine from 10 mg tablet to 25 mg tablet three times daily.May uses current medication by taking one and a half tablet three times daily until completed.   - check Blood pressure daily and record.Notify provide if blood Pressure is greater than 140/90  - hydrALAZINE (APRESOLINE) 25 MG tablet; Take 1 tablet (25 mg total) by mouth 3 (three) times daily.  Dispense: 90 tablet; Refill: 0  2. Swelling of left hand Pain swelling has resolved.gout flare up.declines Allopurinol.  - advised to avoid food high in purine  such as red meat,seasfood or beer etc    Family/ staff Communication: Reviewed plan of care with patient verbalized understanding.   Labs/tests ordered: None   Next Appointment: As needed if symptoms worsen or fail to improve    Caesar Bookman, NP

## 2020-09-23 NOTE — Patient Instructions (Signed)
-   check Blood pressure daily and record.Notify provide if blood Pressure is greater than 140/90   - change  Hydralazine to  25 mg Tablet one by mouth three times daily.May uses current medication by taking one and a half tablet three times daily until completed.

## 2020-09-25 DIAGNOSIS — I10 Essential (primary) hypertension: Secondary | ICD-10-CM | POA: Insufficient documentation

## 2020-09-25 DIAGNOSIS — M1A00X0 Idiopathic chronic gout, unspecified site, without tophus (tophi): Secondary | ICD-10-CM | POA: Insufficient documentation

## 2020-10-08 ENCOUNTER — Other Ambulatory Visit: Payer: Self-pay | Admitting: Family

## 2020-10-08 DIAGNOSIS — I10 Essential (primary) hypertension: Secondary | ICD-10-CM

## 2020-10-15 ENCOUNTER — Other Ambulatory Visit: Payer: Self-pay | Admitting: Family

## 2020-10-15 DIAGNOSIS — I10 Essential (primary) hypertension: Secondary | ICD-10-CM

## 2021-01-12 ENCOUNTER — Other Ambulatory Visit: Payer: Self-pay | Admitting: Family

## 2021-01-12 DIAGNOSIS — I10 Essential (primary) hypertension: Secondary | ICD-10-CM

## 2021-01-30 ENCOUNTER — Other Ambulatory Visit: Payer: Self-pay | Admitting: Family

## 2021-01-30 DIAGNOSIS — F321 Major depressive disorder, single episode, moderate: Secondary | ICD-10-CM

## 2021-01-30 DIAGNOSIS — E785 Hyperlipidemia, unspecified: Secondary | ICD-10-CM

## 2021-01-30 DIAGNOSIS — I10 Essential (primary) hypertension: Secondary | ICD-10-CM

## 2021-02-16 ENCOUNTER — Other Ambulatory Visit: Payer: Self-pay

## 2021-02-16 ENCOUNTER — Ambulatory Visit (INDEPENDENT_AMBULATORY_CARE_PROVIDER_SITE_OTHER): Payer: Medicare HMO | Admitting: Family

## 2021-02-16 ENCOUNTER — Encounter: Payer: Self-pay | Admitting: Family

## 2021-02-16 VITALS — BP 148/80 | HR 74 | Temp 97.3°F | Resp 16 | Ht 70.0 in | Wt 197.0 lb

## 2021-02-16 DIAGNOSIS — M25642 Stiffness of left hand, not elsewhere classified: Secondary | ICD-10-CM | POA: Diagnosis not present

## 2021-02-16 DIAGNOSIS — M25641 Stiffness of right hand, not elsewhere classified: Secondary | ICD-10-CM

## 2021-02-16 DIAGNOSIS — I10 Essential (primary) hypertension: Secondary | ICD-10-CM | POA: Diagnosis not present

## 2021-02-16 MED ORDER — ACETAMINOPHEN 500 MG PO TABS: 500.0000 mg | ORAL_TABLET | Freq: Three times a day (TID) | ORAL | 0 refills | Status: AC | PRN

## 2021-02-16 NOTE — Progress Notes (Signed)
Provider: Richarda Blade FNP-C  Densel Kronick, Donalee Citrin, NP  Patient Care Team: Flynn Gwyn, Donalee Citrin, NP as PCP - General (Family Medicine)  Extended Emergency Contact Information Primary Emergency Contact: [facility] Address: [address]          [place],  [address] Home Phone: [phone] Mobile Phone: [phone] Relation: None Secondary Emergency Contact: Nam, Vossler Home Phone: [phone] Relation: Other  Code Status:  Full Code  Goals of care: Advanced Directive information Advanced Directives 02/16/2021  Does Patient Have a Medical Advance Directive? Yes  Type of Advance Directive Out of facility DNR (pink MOST or yellow form)  Does patient want to make changes to medical advance directive? No - Patient declined  Would patient like information on creating a medical advance directive? -     Chief Complaint  Patient presents with   Acute Visit    Patient is concerned with blood pressure.     HPI:  Pt is a 70 y.o. male seen today for an acute visit for evaluation of high blood pressure. States readings ranging 110/70's - 140's/80's. States had x 1 reading that was 160's/90 thinks it was due to stress going on at home with the wife and daughter.Job continues to be stressful.Also stopped at bogg[employer] had ham biscuit. States a friend advised him to start on some stretching exercises.   Also concerns with stiffness on her fingers due to arthritis.took ibuprofen but does not want to take more medication.Has not been able to play his saxophone due to stiffness of the fingers.      Past Medical History:  Diagnosis Date   Gout    Hypertension    Past Surgical History:  Procedure Laterality Date   OTHER SURGICAL HISTORY  1960   Hernia     Allergies  Allergen Reactions   Shellfish Allergy     Outpatient Encounter Medications as of 02/16/2021  Medication Sig   Cyanocobalamin (VITAMIN B12 PO) Take 1 capsule by mouth daily at 2 am.   hydrALAZINE  (APRESOLINE) 25 MG tablet TAKE 1 TABLET BY MOUTH THREE TIMES A DAY   ibuprofen (ADVIL) 600 MG tablet Take 1 tablet (600 mg total) by mouth every 8 (eight) hours as needed.   lisinopril (ZESTRIL) 40 MG tablet Take 40 mg by mouth daily.   melatonin 3 MG TABS tablet Take 3 mg by mouth at bedtime. May take up to 6mg .   metoprolol succinate (TOPROL-XL) 25 MG 24 hr tablet TAKE 1 TABLET (25 MG TOTAL) BY MOUTH DAILY.   Multiple Vitamin (MULTIVITAMIN ADULT PO) Take 1 capsule by mouth daily.   VITAMIN D PO Take 1 capsule by mouth daily.   No facility-administered encounter medications on file as of 02/16/2021.    Review of Systems  Constitutional:  Negative for appetite change, chills, fatigue, fever and unexpected weight change.  HENT:  Negative for congestion, dental problem, ear discharge, ear pain, facial swelling, hearing loss, nosebleeds, postnasal drip, rhinorrhea, sinus pressure, sinus pain, sneezing, sore throat, tinnitus and trouble swallowing.   Eyes:  Negative for pain, discharge, redness, itching and visual disturbance.  Respiratory:  Negative for cough, chest tightness, shortness of breath and wheezing.   Cardiovascular:  Negative for chest pain, palpitations and leg swelling.  Gastrointestinal:  Negative for abdominal distention, abdominal pain, blood in stool, constipation, diarrhea, nausea and vomiting.  Musculoskeletal:  Positive for arthralgias. Negative for back pain, gait problem, joint swelling, myalgias, neck pain and neck stiffness.       Fingers stiff  Skin:  Negative for color change, pallor, rash and wound.  Neurological:  Negative for dizziness, syncope, speech difficulty, weakness, light-headedness, numbness and headaches.  Hematological:  Does not bruise/bleed easily.  Psychiatric/Behavioral:  Negative for agitation, behavioral problems, confusion, hallucinations and sleep disturbance. The patient is not nervous/anxious.    Immunization History  Administered Date(s)  Administered   Influenza,inj,Quad PF,6+ Mos 04/27/2016, 04/26/2017, 06/19/2018   Influenza-Unspecified 05/13/2015, 04/26/2019, 04/14/2020   PFIZER(Purple Top)SARS-COV-2 Vaccination 08/17/2019, 09/07/2019, 04/14/2020   PPD Test 09/08/2003   Pneumococcal Polysaccharide-23 04/27/2016   Td 05/06/2003   Tdap 05/05/2013, 10/06/2015   Pertinent  Health Maintenance Due  Topic Date Due   COLONOSCOPY (Pts 45-59yrs Insurance coverage will need to be confirmed)  Never done   PNA vac Low Risk Adult (1 of 2 - PCV13) 05/10/2018   INFLUENZA VACCINE  01/23/2021   Fall Risk  02/16/2021 09/23/2020 09/16/2020 08/26/2020 08/18/2020  Falls in the past year? 0 0 0 0 0  Number falls in past yr: 0 0 0 0 0  Injury with Fall? 0 0 0 0 0  Risk for fall due to : No Fall Risks - - - -  Follow up Falls evaluation completed - - - -   Functional Status Survey:    Vitals:   02/16/21 1616  BP: (!) 148/80  Pulse: 74  Resp: 16  Temp: (!) 97.3 F (36.3 C)  SpO2: 98%  Weight: 197 lb (89.4 kg)  Height: 5\' 10"  (1.778 m)   Body mass index is 28.27 kg/m. Physical Exam Vitals reviewed.  Constitutional:      General: He is not in acute distress.    Appearance: Normal appearance. He is overweight. He is not ill-appearing or diaphoretic.  HENT:     Head: Normocephalic.     Mouth/Throat:     Mouth: Mucous membranes are moist.     Pharynx: Oropharynx is clear. No oropharyngeal exudate or posterior oropharyngeal erythema.  Eyes:     General: No scleral icterus.       Right eye: No discharge.        Left eye: No discharge.     Conjunctiva/sclera: Conjunctivae normal.     Pupils: Pupils are equal, round, and reactive to light.  Neck:     Vascular: No carotid bruit.  Cardiovascular:     Rate and Rhythm: Normal rate and regular rhythm.     Pulses: Normal pulses.     Heart sounds: Normal heart sounds. No murmur heard.   No friction rub. No gallop.  Pulmonary:     Effort: Pulmonary effort is normal. No respiratory  distress.     Breath sounds: Normal breath sounds. No wheezing, rhonchi or rales.  Chest:     Chest wall: No tenderness.  Abdominal:     General: Bowel sounds are normal. There is no distension.     Palpations: Abdomen is soft. There is no mass.     Tenderness: There is no abdominal tenderness. There is no right CVA tenderness, left CVA tenderness, guarding or rebound.  Musculoskeletal:        General: No swelling or tenderness.     Right hand: Swelling present. No tenderness. Decreased range of motion. Normal strength. Normal sensation. Normal capillary refill. Normal pulse.     Left hand: Swelling present. No tenderness. Decreased range of motion. Normal strength. Normal sensation. Normal capillary refill. Normal pulse.     Cervical back: Normal range of motion. No rigidity or tenderness.  Left lower leg: No edema.  Lymphadenopathy:     Cervical: No cervical adenopathy.  Skin:    General: Skin is warm and dry.     Coloration: Skin is not pale.     Findings: No bruising or erythema.  Neurological:     Mental Status: He is alert and oriented to person, place, and time.     Cranial Nerves: No cranial nerve deficit.     Sensory: No sensory deficit.     Motor: No weakness.     Coordination: Coordination normal.     Gait: Gait normal.  Psychiatric:        Mood and Affect: Mood normal.        Speech: Speech normal.        Behavior: Behavior normal.        Thought Content: Thought content normal.        Judgment: Judgment normal.    Labs reviewed: Recent Labs    08/02/20 1621  NA 142  K 4.0  CL 106  CO2 25  GLUCOSE 72  BUN 24  CREATININE 1.19  CALCIUM 9.5   Recent Labs    08/02/20 1621  AST 26  ALT 24  BILITOT 0.5  PROT 8.0   Recent Labs    08/02/20 1621 09/16/20 1614  WBC 7.0 6.6  NEUTROABS 3,514 3,808  HGB 11.0* 11.7*  HCT 33.3* 35.0*  MCV 90.5 92.6  PLT 424* 258   Lab Results  Component Value Date   TSH 1.47 08/02/2020   No results found for:  HGBA1C Lab Results  Component Value Date   CHOL 185 08/02/2020   HDL 31 (L) 08/02/2020   LDLCALC 128 (H) 08/02/2020   TRIG 151 (H) 08/02/2020   CHOLHDL 6.0 (H) 08/02/2020    Significant Diagnostic Results in last 30 days:  No results found.  Assessment/Plan 1. Essential hypertension Most home readings are within normal range except one reading that was 160/90 which made him panic.B/p stable today much improvement compared to previous.  Will continue on Hydralazine,lisinopril and metoprolol  - continue to monitor B/p at home and record -  Dietary modification and exercise at least 3 times per week for 30 minutes advised.  2. Stiffness of finger joint of right hand Worsening stiffness and pain on fingers unable to play his saxophone anymore.previous RF was negative. - advised to take tylenol as below  - Ambulatory referral to Rheumatology - acetaminophen (TYLENOL) 500 MG tablet; Take 1 tablet (500 mg total) by mouth every 8 (eight) hours as needed.  Dispense: 30 tablet; Refill: 0  3. Stiffness of finger joint of left hand Tylenol as below  - refer to Rheumatologist  - Ambulatory referral to Rheumatology - acetaminophen (TYLENOL) 500 MG tablet; Take 1 tablet (500 mg total) by mouth every 8 (eight) hours as needed.  Dispense: 30 tablet; Refill: 0  Family/ staff Communication: Reviewed plan of care with patient verbalized understanding   Labs/tests ordered: None   Next Appointment: Has upcoming routine appointment 02/28/2021  Caesar Bookman, NP

## 2021-02-16 NOTE — Patient Instructions (Signed)
-   check Blood pressure at home and record on log provided and notify provider if B/p > 140/90  ? ?

## 2021-02-23 ENCOUNTER — Other Ambulatory Visit: Payer: Medicare HMO

## 2021-02-23 DIAGNOSIS — I10 Essential (primary) hypertension: Secondary | ICD-10-CM

## 2021-02-23 DIAGNOSIS — F321 Major depressive disorder, single episode, moderate: Secondary | ICD-10-CM

## 2021-02-23 DIAGNOSIS — E785 Hyperlipidemia, unspecified: Secondary | ICD-10-CM

## 2021-02-28 ENCOUNTER — Encounter: Payer: Medicare HMO | Admitting: Family

## 2021-03-01 NOTE — Progress Notes (Signed)
  This encounter was created in error - please disregard. No show 

## 2021-03-13 ENCOUNTER — Telehealth: Payer: Self-pay

## 2021-03-13 ENCOUNTER — Ambulatory Visit (INDEPENDENT_AMBULATORY_CARE_PROVIDER_SITE_OTHER): Payer: Medicare HMO | Admitting: Orthopedic Surgery

## 2021-03-13 ENCOUNTER — Encounter: Payer: Self-pay | Admitting: Orthopedic Surgery

## 2021-03-13 ENCOUNTER — Other Ambulatory Visit: Payer: Self-pay

## 2021-03-13 DIAGNOSIS — Z0000 Encounter for general adult medical examination without abnormal findings: Secondary | ICD-10-CM

## 2021-03-13 NOTE — Progress Notes (Signed)
Subjective:   Wayne Morgan is a 70 y.o. male who presents for Medicare Annual/Subsequent preventive examination.  Review of Systems     Cardiac Risk Factors include: hypertension;advanced age (>26men, >42 women)     Objective:    There were no vitals filed for this visit. There is no height or weight on file to calculate BMI.  Advanced Directives 03/13/2021 02/16/2021 09/23/2020 09/16/2020 08/26/2020 08/18/2020 08/01/2020  Does Patient Have a Medical Advance Directive? No Yes Yes Yes Yes No No  Type of Advance Directive - Out of facility DNR (pink MOST or yellow form) Out of facility DNR (pink MOST or yellow form) Out of facility DNR (pink MOST or yellow form) Out of facility DNR (pink MOST or yellow form) - -  Does patient want to make changes to medical advance directive? - No - Patient declined No - Patient declined No - Patient declined No - Patient declined - -  Would patient like information on creating a medical advance directive? - - - - - No - Patient declined No - Patient declined    Current Medications (verified) Outpatient Encounter Medications as of 03/13/2021  Medication Sig   acetaminophen (TYLENOL) 500 MG tablet Take 1 tablet (500 mg total) by mouth every 8 (eight) hours as needed.   Cyanocobalamin (VITAMIN B12 PO) Take 1 capsule by mouth daily at 2 am.   hydrALAZINE (APRESOLINE) 25 MG tablet TAKE 1 TABLET BY MOUTH THREE TIMES A DAY   ibuprofen (ADVIL) 600 MG tablet Take 1 tablet (600 mg total) by mouth every 8 (eight) hours as needed.   lisinopril (ZESTRIL) 40 MG tablet Take 40 mg by mouth daily.   melatonin 3 MG TABS tablet Take 3 mg by mouth at bedtime. May take up to .   Multiple Vitamin (MULTIVITAMIN ADULT PO) Take 1 capsule by mouth daily.   VITAMIN D PO Take 1 capsule by mouth daily.   metoprolol succinate (TOPROL-XL) 25 MG 24 hr tablet TAKE 1 TABLET (25 MG TOTAL) BY MOUTH DAILY. (Patient not taking: Reported on 03/13/2021)   No facility-administered  encounter medications on file as of 03/13/2021.    Allergies (verified) Shellfish allergy   History: Past Medical History:  Diagnosis Date   Gout    Hypertension    Past Surgical History:  Procedure Laterality Date   OTHER SURGICAL HISTORY  1960   Hernia    Family History  Problem Relation Age of Onset   Cancer Mother    Stroke Father    Cancer Brother    Social History   Socioeconomic History   Marital status: Married    Spouse name: Not on file   Number of children: Not on file   Years of education: Not on file   Highest education level: Not on file  Occupational History   Not on file  Tobacco Use   Smoking status: Former    Packs/day: 0.50    Years: 25.00    Pack years: 12.50    Types: Cigarettes   Smokeless tobacco: Never  Substance and Sexual Activity   Alcohol use: Not Currently   Drug use: Not Currently   Sexual activity: Not on file  Other Topics Concern   Not on file  Social History Narrative   Tobacco use, amount per day now: None.   Past tobacco use, amount per day: 1/2 pack a day for 25 years.   How many years did you use tobacco: 25 years.   Alcohol use (drinks  per week): None.   Diet: Fruits, Vegetables, Fried Foods, [employer].   Do you drink/eat things with caffeine: Coffee, and Soda.   Marital status: Married                                  What year were you married? 1996   Do you live in a house, apartment, assisted living, condo, trailer, etc.? House   Is it one or more stories? One.   How many persons live in your home? 2   Do you have pets in your home?( please list) 1 Dog   Highest Level of education completed? Bachelors in Music.   Current or past profession: Part time Copy.   Do you exercise? Yes.                                 Type and how often? Walk Daily.   Do you have a living will?  No   Do you have a DNR form?    No                               If not, do you want to discuss one?   Do you have signed POA/HPOA  forms? No                       If so, please bring to you appointment      Do you have any difficulty bathing or dressing yourself? No.   Do you have any difficulty preparing food or eating? No.   Do you have any difficulty managing your medications? No.   Do you have any difficulty managing your finances? No.   Do you have any difficulty affording your medications? No.   Social Determinants of Health   Financial Resource Strain: Low Risk    Difficulty of Paying Living Expenses: Not hard at all  Food Insecurity: No Food Insecurity   Worried About Programme researcher, broadcasting/film/video in the Last Year: Never true   Ran Out of Food in the Last Year: Never true  Transportation Needs: No Transportation Needs   Lack of Transportation (Medical): No   Lack of Transportation (Non-Medical): No  Physical Activity: Insufficiently Active   Days of Exercise per Week: 3 days   Minutes of Exercise per Session: 20 min  Stress: Stress Concern Present   Feeling of Stress : To some extent  Social Connections: Press photographer of Communication with Friends and Family: Twice a week   Frequency of Social Gatherings with Friends and Family: Once a week   Attends Religious Services: More than 4 times per year   Active Member of [employer] or Organizations: Yes   Attends Banker Meetings: Never   Marital Status: Married    Tobacco Counseling Counseling given: Not Answered   Clinical Intake:  Pre-visit preparation completed: No  Pain : No/denies pain     BMI - recorded: 29.1 Nutritional Status: BMI 25 -29 Overweight Nutritional Risks: None Diabetes: No  How often do you need to have someone help you when you read instructions, pamphlets, or other written materials from your doctor or pharmacy?: 1 - Never What is the last grade level you completed in school?: College Graduate  Diabetic?No  Activities of Daily Living In your present state of health, do you have any  difficulty performing the following activities: 03/13/2021  Hearing? N  Vision? N  Difficulty concentrating or making decisions? N  Walking or climbing stairs? N  Dressing or bathing? N  Doing errands, shopping? N  Preparing Food and eating ? N  Using the Toilet? N  In the past six months, have you accidently leaked urine? N  Do you have problems with loss of bowel control? N  Managing your Finances? N  Housekeeping or managing your Housekeeping? N  Some recent data might be hidden    Patient Care Team: Ngetich, Donalee Citrin, NP as PCP - General (Family Medicine)  Indicate any recent Medical Services you may have received from other than [facility] providers in the past year (date may be approximate).     Assessment:   This is a routine wellness examination for [facility].  Hearing/Vision screen Hearing Screening - Comments:: Patient has no hearing problems Vision Screening - Comments:: Patient has no vision problems. Patient has not had an eye exam in years.  Dietary issues and exercise activities discussed: Current Exercise Habits: The patient does not participate in regular exercise at present   Goals Addressed             This Visit's Progress    Activity and Exercise Increased   On track    Evidence-based guidance:  Review current exercise levels.  Assess patient perspective on exercise or activity level, barriers to increasing activity, motivation and readiness for change.  Recommend or set healthy exercise goal based on individual tolerance.  Encourage small steps toward making change in amount of exercise or activity.  Urge reduction of sedentary activities or screen time.  Promote group activities within the community or with family or support person.  Consider referral to rehabiliation therapist for assessment and exercise/activity plan.   Notes:        Depression Screen PHQ 2/9 Scores 03/13/2021 03/13/2021  PHQ - 2 Score 0 0    Fall Risk Fall Risk  03/13/2021  03/13/2021 02/16/2021 09/23/2020 09/16/2020  Falls in the past year? 0 0 0 0 0  Number falls in past yr: 0 0 0 0 0  Injury with Fall? 0 0 0 0 0  Risk for fall due to : No Fall Risks No Fall Risks No Fall Risks - -  Follow up - Falls evaluation completed Falls evaluation completed - -    FALL RISK PREVENTION PERTAINING TO THE HOME:  Any stairs in or around the home? Yes  If so, are there any without handrails? Yes  Home free of loose throw rugs in walkways, pet beds, electrical cords, etc? Yes  Adequate lighting in your home to reduce risk of falls? Yes   ASSISTIVE DEVICES UTILIZED TO PREVENT FALLS:  Life alert? No  Use of a cane, walker or w/c? No  Grab bars in the bathroom? Yes  Shower chair or bench in shower? No  Elevated toilet seat or a handicapped toilet? Yes   TIMED UP AND GO:  Was the test performed? No .  Length of time to ambulate 10 feet: N/A sec.   Gait steady and fast without use of assistive device  Cognitive Function: MMSE - Mini Mental State Exam 08/26/2020  Orientation to time 5  Orientation to time comments 2022, Spring, 4th, Friday, March  Orientation to Place 5  Orientation to Place-comments [place], [place], [place], [employer].  Registration 3  Attention/  Calculation 5  Recall 1  Recall-comments patient could only recall TABLE .  Language- name 2 objects 2  Language- repeat 1  Language- follow 3 step command 3  Language- read & follow direction 1  Write a sentence 1  Copy design 1  Total score 28     6CIT Screen 03/13/2021  What Year? 0 points  What month? 0 points  What time? 0 points  Count back from 20 0 points  Months in reverse 0 points  Repeat phrase 6 points  Total Score 6    Immunizations Immunization History  Administered Date(s) Administered   Influenza,inj,Quad PF,6+ Mos 04/27/2016, 04/26/2017, 06/19/2018   Influenza-Unspecified 05/13/2015, 04/26/2019, 04/14/2020   PFIZER(Purple Top)SARS-COV-2 Vaccination 08/17/2019, 09/07/2019,  04/14/2020   PPD Test 09/08/2003   Pneumococcal Polysaccharide-23 04/27/2016   Td 05/06/2003   Tdap 05/05/2013, 10/06/2015    TDAP status: Up to date  Flu Vaccine status: Due, Education has been provided regarding the importance of this vaccine. Advised may receive this vaccine at local pharmacy or Health Dept. Aware to provide a copy of the vaccination record if obtained from local pharmacy or Health Dept. Verbalized acceptance and understanding.  Pneumococcal vaccine status: Up to date  Covid-19 vaccine status: Completed vaccines  Qualifies for Shingles Vaccine? Yes   Zostavax completed No   Shingrix Completed?: No.    Education has been provided regarding the importance of this vaccine. Patient has been advised to call insurance company to determine out of pocket expense if they have not yet received this vaccine. Advised may also receive vaccine at local pharmacy or Health Dept. Verbalized acceptance and understanding.  Screening Tests Health Maintenance  Topic Date Due   COLONOSCOPY (Pts 45-72yrs Insurance coverage will need to be confirmed)  Never done   Zoster Vaccines- Shingrix (1 of 2) Never done   COVID-19 Vaccine (4 - Booster for Pfizer series) 08/15/2020   INFLUENZA VACCINE  01/23/2021   TETANUS/TDAP  10/05/2025   Hepatitis C Screening  Completed   HPV VACCINES  Aged Out    Health Maintenance  Health Maintenance Due  Topic Date Due   COLONOSCOPY (Pts 45-71yrs Insurance coverage will need to be confirmed)  Never done   Zoster Vaccines- Shingrix (1 of 2) Never done   COVID-19 Vaccine (4 - Booster for Pfizer series) 08/15/2020   INFLUENZA VACCINE  01/23/2021    Colorectal cancer screening: Type of screening: Colonoscopy. Completed cannot remember. Repeat every cannot remember years  Lung Cancer Screening: (Low Dose CT Chest recommended if Age 64-80 years, 30 pack-year currently smoking OR have quit w/in 15years.) does not qualify.   Lung Cancer Screening  Referral: No  Additional Screening:  Hepatitis C Screening: does qualify; Completed 2022  Vision Screening: Recommended annual ophthalmology exams for early detection of glaucoma and other disorders of the eye. Is the patient up to date with their annual eye exam?  No  Who is the provider or what is the name of the office in which the patient attends annual eye exams? N/A If pt is not established with a provider, would they like to be referred to a provider to establish care? No .   Dental Screening: Recommended annual dental exams for proper oral hygiene  Community Resource Referral / Chronic Care Management: CRR required this visit?  No   CCM required this visit?  No      Plan:     I have personally reviewed and noted the following in the patient's chart:  Medical and social history Use of alcohol, tobacco or illicit drugs  Current medications and supplements including opioid prescriptions. Patient is not currently taking opioid prescriptions. Functional ability and status Nutritional status Physical activity Advanced directives List of other physicians Hospitalizations, surgeries, and ER visits in previous 12 months Vitals Screenings to include cognitive, depression, and falls Referrals and appointments  In addition, I have reviewed and discussed with patient certain preventive protocols, quality metrics, and best practice recommendations. A written personalized care plan for preventive services as well as general preventive health recommendations were provided to patient.     Octavia Heir, NP   03/13/2021

## 2021-03-13 NOTE — Progress Notes (Signed)
This service is provided via telemedicine  No vital signs collected/recorded due to the encounter was a telemedicine visit.   Location of patient (ex: home, work):  Home  Patient consents to a telephone visit:  Yes, see encounter dated 03/13/2021  Location of the provider (ex: office, home):  [facility]  Name of any referring provider:  Ngetich, Dinah, NP  Names of all persons participating in the telemedicine service and their role in the encounter:  Hazle Nordmann, NP, Elveria Royals, CMA,and patient.  Time spent on call:  10 minutes with medical assistant

## 2021-03-13 NOTE — Telephone Encounter (Signed)
Mr. Wayne Morgan, Wayne Morgan are scheduled for a virtual visit with your provider today.    Just as we do with appointments in the office, we must obtain your consent to participate.  Your consent will be active for this visit and any virtual visit you may have with one of our providers in the next 365 days.    If you have a MyChart account, I can also send a copy of this consent to you electronically.  All virtual visits are billed to your insurance company just like a traditional visit in the office.  As this is a virtual visit, video technology does not allow for your provider to perform a traditional examination.  This may limit your provider's ability to fully assess your condition.  If your provider identifies any concerns that need to be evaluated in person or the need to arrange testing such as labs, EKG, etc, we will make arrangements to do so.    Although advances in technology are sophisticated, we cannot ensure that it will always work on either your end or our end.  If the connection with a video visit is poor, we may have to switch to a telephone visit.  With either a video or telephone visit, we are not always able to ensure that we have a secure connection.   I need to obtain your verbal consent now.   Are you willing to proceed with your visit today?   Wayne Morgan has provided verbal consent on 03/13/2021 for a virtual visit (video or telephone).   Elveria Royals, [facility] 03/13/2021  3:51 PM

## 2021-03-13 NOTE — Patient Instructions (Signed)
  Wayne Morgan , Thank you for taking time to come for your Medicare Wellness Visit. I appreciate your ongoing commitment to your health goals. Please review the following plan we discussed and let me know if I can assist you in the future.   These are the goals we discussed:  Goals      Activity and Exercise Increased     Evidence-based guidance:  Review current exercise levels.  Assess patient perspective on exercise or activity level, barriers to increasing activity, motivation and readiness for change.  Recommend or set healthy exercise goal based on individual tolerance.  Encourage small steps toward making change in amount of exercise or activity.  Urge reduction of sedentary activities or screen time.  Promote group activities within the community or with family or support person.  Consider referral to rehabiliation therapist for assessment and exercise/activity plan.   Notes:         This is a list of the screening recommended for you and due dates:  Health Maintenance  Topic Date Due   Colon Cancer Screening  Never done   Zoster (Shingles) Vaccine (1 of 2) Never done   COVID-19 Vaccine (4 - Booster for Pfizer series) 08/15/2020   Flu Shot  01/23/2021   Tetanus Vaccine  10/05/2025   Hepatitis C Screening: USPSTF Recommendation to screen - Ages 18-79 yo.  Completed   HPV Vaccine  Aged Out   Recommend getting flu vaccine, 3rd covid booster and shingles vaccine at local pharmacy.

## 2021-04-17 ENCOUNTER — Ambulatory Visit: Payer: Medicare HMO | Admitting: Internal Medicine

## 2021-04-21 ENCOUNTER — Telehealth: Payer: Self-pay | Admitting: Family

## 2021-04-21 NOTE — Telephone Encounter (Signed)
I called the patient to try to proceed with a 44mo follow-up appointment originally scheduled on 02/28/2021 that was a "no show". Patient did not answer, I left a vm.

## 2021-05-31 NOTE — Progress Notes (Deleted)
Office Visit Note  Patient: Wayne Morgan             Date of Birth: 1951-02-18           MRN: [id number]             PCP: Sandrea Hughs, NP Referring: Sandrea Hughs, NP Visit Date: 06/01/2021 Occupation: _0 @  Subjective:  No chief complaint on file.   History of Present Illness: Wayne Morgan is a 70 y.o. male here for pain and stiffness of fingers. He was previously treated with prednisone for suspected gout flare but not on maintenance treatment. Symptoms are causing decreased function including loss of ability to play saxophone as usual.***   Labs reviewed 08/2020 ESR >130 CRP 62.9 CBC Hgb 11.7 Urine dip unremarkable Uric acid 9.5  07/2020 RF neg ESR >130 CRP 14.6 Uric acid 9.9 Vit D wnl CMP wnl HCV neg  Activities of Daily Living:  Patient reports morning stiffness for *** {minute/hour:19697}.   Patient {ACTIONS;DENIES/REPORTS:21021675::"Denies"} nocturnal pain.  Difficulty dressing/grooming: {ACTIONS;DENIES/REPORTS:21021675::"Denies"} Difficulty climbing stairs: {ACTIONS;DENIES/REPORTS:21021675::"Denies"} Difficulty getting out of chair: {ACTIONS;DENIES/REPORTS:21021675::"Denies"} Difficulty using hands for taps, buttons, cutlery, and/or writing: {ACTIONS;DENIES/REPORTS:21021675::"Denies"}  No Rheumatology ROS completed.   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   Past tobacco use, amount per day: 1/2 pack a day for 25 years.    How many years did you use tobacco: 25 years.   Alcohol use (drinks per week): None.   Diet: Fruits, Vegetables, Fried Foods, [employer].   Do you drink/eat things with caffeine: Coffee, and Soda.   Marital status: Married                                  What year were you married? 1996   Do you live in a house, apartment, assisted living, condo, trailer, etc.? House   Is it one or more stories? One.   How many persons live in your home? 2   Do you have pets in your home?( please list) 1 Dog   Highest Level of education completed? Bachelors in Music.   Current or past profession: Part time Retail buyer.   Do you exercise? Yes.                                 Type and how often? Walk Daily.   Do you have a living will?  No   Do you have a DNR form?    No                               If not, do you want to discuss one?   Do you have signed POA/HPOA forms? No  If so, please bring to you appointment      Do you have any difficulty bathing or dressing yourself? No.   Do you have any difficulty preparing food or eating? No.   Do you have any difficulty managing your medications? No.   Do you have any difficulty managing your finances? No.   Do you have any difficulty affording your medications? No.   Immunization History  Administered Date(s) Administered   Influenza,inj,Quad PF,6+ Mos 04/27/2016, 04/26/2017, 06/19/2018   Influenza-Unspecified 05/13/2015, 04/26/2019, 04/14/2020   PFIZER(Purple Top)SARS-COV-2 Vaccination 08/17/2019, 09/07/2019, 04/14/2020   PPD Test 09/08/2003   Pneumococcal Polysaccharide-23 04/27/2016   Td 05/06/2003   Tdap 05/05/2013, 10/06/2015     Objective: Vital Signs: There were no vitals taken for this visit.   Physical Exam   Musculoskeletal Exam: ***  CDAI Exam: CDAI Score: -- Patient Global: --; Provider Global: -- Swollen: --; Tender: -- Joint Exam 06/01/2021   No joint exam has been documented for this visit   There is  currently no information documented on the homunculus. Go to the Rheumatology activity and complete the homunculus joint exam.  Investigation: No additional findings.  Imaging: No results found.  Recent Labs: Lab Results  Component Value Date   WBC 6.6 09/16/2020   HGB 11.7 (L) 09/16/2020   PLT 258 09/16/2020   NA 142 08/02/2020   K 4.0 08/02/2020   CL 106 08/02/2020   CO2 25 08/02/2020   GLUCOSE 72 08/02/2020   BUN 24 08/02/2020   CREATININE 1.19 08/02/2020   BILITOT 0.5 08/02/2020   AST 26 08/02/2020   ALT 24 08/02/2020   PROT 8.0 08/02/2020   CALCIUM 9.5 08/02/2020   GFRAA 73 08/02/2020    Speciality Comments: No specialty comments available.  Procedures:  No procedures performed Allergies: Shellfish allergy   Assessment / Plan:     Visit Diagnoses: No diagnosis found.  Orders: No orders of the defined types were placed in this encounter.  No orders of the defined types were placed in this encounter.   Face-to-face time spent with patient was *** minutes. Greater than 50% of time was spent in counseling and coordination of care.  Follow-Up Instructions: No follow-ups on file.   Collier Salina, MD  Note - This record has been created using [employer].  Chart creation errors have been sought, but may not always  have been located. Such creation errors do not reflect on  the standard of medical care.

## 2021-06-01 ENCOUNTER — Ambulatory Visit: Payer: Medicare HMO | Admitting: Internal Medicine

## 2021-12-28 ENCOUNTER — Other Ambulatory Visit: Payer: Self-pay | Admitting: Family

## 2021-12-28 DIAGNOSIS — I10 Essential (primary) hypertension: Secondary | ICD-10-CM

## 2022-03-23 ENCOUNTER — Encounter: Payer: Self-pay | Admitting: Family

## 2022-03-23 ENCOUNTER — Encounter: Payer: Medicare HMO | Admitting: Family

## 2022-03-23 NOTE — Progress Notes (Signed)
   This encounter was created in error - please disregard. Patient states following up with another PCP will verify with wife then call [place] to update.records indicates seen at the [place]

## 2022-10-11 ENCOUNTER — Encounter: Payer: Self-pay | Admitting: Family

## 2022-10-11 ENCOUNTER — Encounter: Payer: Medicare HMO | Admitting: Family

## 2022-10-11 NOTE — Progress Notes (Signed)
  This encounter was created in error - please disregard. No show 

## 2022-10-24 ENCOUNTER — Encounter: Payer: Medicare HMO | Admitting: Family

## 2022-10-24 ENCOUNTER — Ambulatory Visit (INDEPENDENT_AMBULATORY_CARE_PROVIDER_SITE_OTHER): Payer: Medicare HMO | Admitting: Adult Health

## 2022-10-24 DIAGNOSIS — Z0000 Encounter for general adult medical examination without abnormal findings: Secondary | ICD-10-CM

## 2022-10-24 NOTE — Progress Notes (Signed)
This service is provided via telemedicine  No vital signs collected/recorded due to the encounter was a telemedicine visit.   Location of patient (ex: home, work):  Home   Patient consents to a telephone visit:  03/13/2021   Location of the provider (ex: office, home):  [facility]  Name of any referring provider:  N/A  Names of all persons participating in the telemedicine service and their role in the encounter: Winfrey Chillemi B/CMA, Monina C. Grayce Sessions, NP , and patient  Time spent on call:  11 minutes

## 2022-10-24 NOTE — Patient Instructions (Signed)
  Mr. Aro , Thank you for taking time to come for your Medicare Wellness Visit. I appreciate your ongoing commitment to your health goals. Please review the following plan we discussed and let me know if I can assist you in the future.   These are the goals we discussed:  Goals      Activity and Exercise Increased     Evidence-based guidance:  Review current exercise levels.  Assess patient perspective on exercise or activity level, barriers to increasing activity, motivation and readiness for change.  Recommend or set healthy exercise goal based on individual tolerance.  Encourage small steps toward making change in amount of exercise or activity.  Urge reduction of sedentary activities or screen time.  Promote group activities within the community or with family or support person.  Consider referral to rehabiliation therapist for assessment and exercise/activity plan.   Notes:      DIET - EAT MORE FRUITS AND VEGETABLES     -  eats watermelon in moderation -  will eat plenty of green vegetables -  drink water more        This is a list of the screening recommended for you and due dates:  Health Maintenance  Topic Date Due   Colon Cancer Screening  Never done   Zoster (Shingles) Vaccine (1 of 2) Never done   Flu Shot  01/24/2023   Pneumonia Vaccine (2 of 2 - PCV) 05/27/2023   Medicare Annual Wellness Visit  10/24/2023   DTaP/Tdap/Td vaccine (5 - Td or Tdap) 10/05/2025   COVID-19 Vaccine  Completed   Hepatitis C Screening: USPSTF Recommendation to screen - Ages 53-79 yo.  Completed   HPV Vaccine  Aged Out

## 2022-10-24 NOTE — Progress Notes (Signed)
Subjective:   Wayne Morgan is a 72 y.o. male who presents for Medicare Annual/Subsequent preventive examination.  Review of Systems    Cardiac Risk Factors include: advanced age (>22men, >62 women);hypertension;male gender     Objective:    Today's Vitals   10/24/22 1025  PainSc: 8    There is no height or weight on file to calculate BMI.     03/23/2022    3:07 PM 03/13/2021    3:36 PM 02/16/2021    4:17 PM 09/23/2020    3:42 PM 09/16/2020    3:24 PM 08/26/2020    3:49 PM 08/18/2020    3:25 PM  Advanced Directives  Does Patient Have a Medical Advance Directive? No No Yes Yes Yes Yes No  Type of Advance Directive   Out of facility DNR (pink MOST or yellow form) Out of facility DNR (pink MOST or yellow form) Out of facility DNR (pink MOST or yellow form) Out of facility DNR (pink MOST or yellow form)   Does patient want to make changes to medical advance directive?   No - Patient declined No - Patient declined No - Patient declined No - Patient declined   Would patient like information on creating a medical advance directive? No - Patient declined      No - Patient declined    Current Medications (verified) Outpatient Encounter Medications as of 10/24/2022  Medication Sig   acetaminophen (TYLENOL) 500 MG tablet Take 1 tablet (500 mg total) by mouth every 8 (eight) hours as needed.   Cyanocobalamin (VITAMIN B12 PO) Take 1 capsule by mouth daily at 2 am.   hydrALAZINE (APRESOLINE) 25 MG tablet TAKE 1 TABLET BY MOUTH THREE TIMES A DAY   ibuprofen (ADVIL) 600 MG tablet Take 1 tablet (600 mg total) by mouth every 8 (eight) hours as needed.   melatonin 3 MG TABS tablet Take 3 mg by mouth at bedtime. May take up to 6mg .   metoprolol succinate (TOPROL-XL) 25 MG 24 hr tablet TAKE 1 TABLET (25 MG TOTAL) BY MOUTH DAILY.   Multiple Vitamin (MULTIVITAMIN ADULT PO) Take 1 capsule by mouth daily.   VITAMIN D PO Take 1 capsule by mouth daily.   [DISCONTINUED] lisinopril (ZESTRIL) 40 MG  tablet Take 40 mg by mouth daily.   No facility-administered encounter medications on file as of 10/24/2022.    Allergies (verified) Shellfish allergy   History: Past Medical History:  Diagnosis Date   Gout    Hypertension    Past Surgical History:  Procedure Laterality Date   OTHER SURGICAL HISTORY  1960   Hernia    Family History  Problem Relation Age of Onset   Cancer Mother    Stroke Father    Cancer Brother    Social History   Socioeconomic History   Marital status: Married    Spouse name: Not on file   Number of children: Not on file   Years of education: Not on file   Highest education level: Not on file  Occupational History   Not on file  Tobacco Use   Smoking status: Former    Packs/day: 0.50    Years: 25.00    Additional pack years: 0.00    Total pack years: 12.50    Types: Cigarettes   Smokeless tobacco: Never  Vaping Use   Vaping Use: Not on file  Substance and Sexual Activity   Alcohol use: Not Currently   Drug use: Not Currently   Sexual activity:  Not on file  Other Topics Concern   Not on file  Social History Narrative   Tobacco use, amount per day now: None.   Past tobacco use, amount per day: 1/2 pack a day for 25 years.   How many years did you use tobacco: 25 years.   Alcohol use (drinks per week): None.   Diet: Fruits, Vegetables, Fried Foods, [employer].   Do you drink/eat things with caffeine: Coffee, and Soda.   Marital status: Married                                  What year were you married? 1996   Do you live in a house, apartment, assisted living, condo, trailer, etc.? House   Is it one or more stories? One.   How many persons live in your home? 2   Do you have pets in your home?( please list) 1 Dog   Highest Level of education completed? Bachelors in Music.   Current or past profession: Part time Copy.   Do you exercise? Yes.                                 Type and how often? Walk Daily.   Do you have a living will?   No   Do you have a DNR form?    No                               If not, do you want to discuss one?   Do you have signed POA/HPOA forms? No                       If so, please bring to you appointment      Do you have any difficulty bathing or dressing yourself? No.   Do you have any difficulty preparing food or eating? No.   Do you have any difficulty managing your medications? No.   Do you have any difficulty managing your finances? No.   Do you have any difficulty affording your medications? No.   Social Determinants of Health   Financial Resource Strain: Low Risk  (03/13/2021)   Overall Financial Resource Strain (CARDIA)    Difficulty of Paying Living Expenses: Not hard at all  Food Insecurity: No Food Insecurity (03/13/2021)   Hunger Vital Sign    Worried About Running Out of Food in the Last Year: Never true    Ran Out of Food in the Last Year: Never true  Transportation Needs: No Transportation Needs (03/13/2021)   PRAPARE - Administrator, Civil Service (Medical): No    Lack of Transportation (Non-Medical): No  Physical Activity: Insufficiently Active (03/13/2021)   Exercise Vital Sign    Days of Exercise per Week: 3 days    Minutes of Exercise per Session: 20 min  Stress: Stress Concern Present (03/13/2021)   [employer] of Occupational Health - Occupational Stress Questionnaire    Feeling of Stress : To some extent  Social Connections: Socially Integrated (03/13/2021)   Social Connection and Isolation Panel [NHANES]    Frequency of Communication with Friends and Family: Twice a week    Frequency of Social Gatherings with Friends and Family: Once a week    Attends Religious Services: More  than 4 times per year    Active Member of Clubs or Organizations: Yes    Attends Banker Meetings: Never    Marital Status: Married    Tobacco Counseling Counseling given: Not Answered   Clinical Intake:  Pre-visit preparation completed: No  Pain :  0-10 Pain Score: 8  Pain Location: Knee Pain Orientation: Left Pain Descriptors / Indicators: Aching Pain Onset: Other (comment) (4-5 days) Pain Frequency: Constant Pain Relieving Factors: Tylenol PRN Effect of Pain on Daily Activities: able to do daily activities  Pain Relieving Factors: Tylenol PRN  BMI - recorded: 28.27 (28.27) Nutritional Status: BMI 25 -29 Overweight Nutritional Risks: None Diabetes: No  How often do you need to have someone help you when you read instructions, pamphlets, or other written materials from your doctor or pharmacy?: 1 - Never What is the last grade level you completed in school?: BA degree  Diabetic?No  Interpreter Needed?: No  Information entered by :: Littie Chiem Medina-Vargas DNP   Activities of Daily Living    10/24/2022   10:12 AM  In your present state of health, do you have any difficulty performing the following activities:  Hearing? 0  Vision? 0  Difficulty concentrating or making decisions? 0  Walking or climbing stairs? 1  Dressing or bathing? 0  Doing errands, shopping? 0  Preparing Food and eating ? N  Using the Toilet? N  In the past six months, have you accidently leaked urine? N  Do you have problems with loss of bowel control? N  Managing your Medications? N  Managing your Finances? N  Housekeeping or managing your Housekeeping? N    Patient Care Team: Ngetich, Donalee Citrin, NP as PCP - General (Family Medicine)  Indicate any recent Medical Services you may have received from other than [facility] providers in the past year (date may be approximate).     Assessment:   This is a routine wellness examination for [facility].  Hearing/Vision screen Hearing Screening - Comments:: Good hearing Vision Screening - Comments:: Good vision  Dietary issues and exercise activities discussed: Current Exercise Habits: Home exercise routine, Type of exercise: walking, Time (Minutes): 60, Frequency (Times/Week): 6, Weekly Exercise  (Minutes/Week): 360, Intensity: Moderate, Exercise limited by: None identified   Goals Addressed             This Visit's Progress    DIET - EAT MORE FRUITS AND VEGETABLES       -  eats watermelon in moderation -  will eat plenty of green vegetables -  drink water more       Depression Screen    10/24/2022   10:10 AM 03/23/2022    3:12 PM 03/13/2021    4:04 PM 03/13/2021    3:36 PM  PHQ 2/9 Scores  PHQ - 2 Score 0 0 0 0  PHQ- 9 Score  0      Fall Risk    10/24/2022   10:09 AM 03/23/2022    3:05 PM 03/23/2022    3:01 PM 03/13/2021    4:08 PM 03/13/2021    3:37 PM  Fall Risk   Falls in the past year? 0 0 0 0 0  Number falls in past yr: 0 0 0 0 0  Injury with Fall? 0 0 0 0 0  Risk for fall due to : No Fall Risks History of fall(s)  No Fall Risks No Fall Risks  Follow up Falls evaluation completed Falls evaluation completed   Falls evaluation completed  FALL RISK PREVENTION PERTAINING TO THE HOME:  Any stairs in or around the home? No  If so, are there any without handrails? No  Home free of loose throw rugs in walkways, pet beds, electrical cords, etc? Yes  Adequate lighting in your home to reduce risk of falls? Yes   ASSISTIVE DEVICES UTILIZED TO PREVENT FALLS:  Life alert? No  Use of a cane, walker or w/c? No  Grab bars in the bathroom? No  Shower chair or bench in shower? Yes  Elevated toilet seat or a handicapped toilet? No   TIMED UP AND GO:  Was the test performed? No .  Length of time to ambulate 10 feet: N/A sec.   Gait steady and fast without use of assistive device  Cognitive Function:    08/26/2020    4:38 PM  MMSE - Mini Mental State Exam  Orientation to time 5  Orientation to time comments 2022, Spring, 4th, Friday, March  Orientation to Place 5  Orientation to Place-comments [place], [place], [place], [employer].  Registration 3  Attention/ Calculation 5  Recall 1  Recall-comments patient could only recall TABLE .  Language- name 2  objects 2  Language- repeat 1  Language- follow 3 step command 3  Language- read & follow direction 1  Write a sentence 1  Copy design 1  Total score 28        10/24/2022   10:10 AM 03/23/2022    3:05 PM 03/13/2021    3:37 PM  6CIT Screen  What Year? 0 points 0 points 0 points  What month? 0 points 0 points 0 points  What time? 0 points 0 points 0 points  Count back from 20 0 points 0 points 0 points  Months in reverse 0 points 0 points 0 points  Repeat phrase 0 points 0 points 6 points  Total Score 0 points 0 points 6 points    Immunizations Immunization History  Administered Date(s) Administered   COVID-19, mRNA, vaccine(Comirnaty)12 years and older 05/26/2022   Fluad Quad(high Dose 65+) 05/26/2022   Influenza,inj,Quad PF,6+ Mos 04/27/2016, 04/26/2017, 06/19/2018   Influenza-Unspecified 05/13/2015, 04/26/2019, 04/14/2020, 03/25/2021   PFIZER(Purple Top)SARS-COV-2 Vaccination 08/17/2019, 09/07/2019, 04/14/2020   PPD Test 09/08/2003   Pneumococcal Polysaccharide-23 04/27/2016, 05/26/2022   Td 05/06/2003   Td (Adult), 2 Lf Tetanus Toxid, Preservative Free 05/06/2003   Tdap 05/05/2013, 10/06/2015    TDAP status: Up to date  Flu Vaccine status: Up to date  Pneumococcal vaccine status: Up to date  Covid-19 vaccine status: Completed vaccines  Qualifies for Shingles Vaccine? No   Zostavax completed No   Shingrix Completed?: No.    Education has been provided regarding the importance of this vaccine. Patient has been advised to call insurance company to determine out of pocket expense if they have not yet received this vaccine. Advised may also receive vaccine at local pharmacy or Health Dept. Verbalized acceptance and understanding.  Screening Tests Health Maintenance  Topic Date Due   COLONOSCOPY (Pts 45-47yrs Insurance coverage will need to be confirmed)  Never done   Zoster Vaccines- Shingrix (1 of 2) Never done   INFLUENZA VACCINE  01/24/2023   Pneumonia Vaccine 55+  Years old (2 of 2 - PCV) 05/27/2023   Medicare Annual Wellness (AWV)  10/24/2023   DTaP/Tdap/Td (5 - Td or Tdap) 10/05/2025   COVID-19 Vaccine  Completed   Hepatitis C Screening  Completed   HPV VACCINES  Aged Out    Health Maintenance  Health  Maintenance Due  Topic Date Due   COLONOSCOPY (Pts 45-64yrs Insurance coverage will need to be confirmed)  Never done   Zoster Vaccines- Shingrix (1 of 2) Never done    Colorectal cancer screening: Type of screening: Colonoscopy. Completed on 11/17/22. Repeat every   years  Lung Cancer Screening: (Low Dose CT Chest recommended if Age 13-80 years, 30 pack-year currently smoking OR have quit w/in 15years.) does not qualify.   Lung Cancer Screening Referral: NO  Additional Screening:  Hepatitis C Screening: does not qualify; Completed 08/03/19  Vision Screening: Recommended annual ophthalmology exams for early detection of glaucoma and other disorders of the eye. Is the patient up to date with their annual eye exam?  No  Who is the provider or what is the name of the office in which the patient attends annual eye exams?  [facility] If pt is not established with a provider, would they like to be referred to a provider to establish care? No .   Dental Screening: Recommended annual dental exams for proper oral hygiene  Community Resource Referral / Chronic Care Management: CRR required this visit?  Yes   CCM required this visit?  No      Plan:     I have personally reviewed and noted the following in the patient's chart:   Medical and social history Use of alcohol, tobacco or illicit drugs  Current medications and supplements including opioid prescriptions. Patient is not currently taking opioid prescriptions. Functional ability and status Nutritional status Physical activity Advanced directives List of other physicians Hospitalizations, surgeries, and ER visits in previous 12 months Vitals Screenings to include cognitive, depression, and  falls Referrals and appointments  In addition, I have reviewed and discussed with patient certain preventive protocols, quality metrics, and best practice recommendations. A written personalized care plan for preventive services as well as general preventive health recommendations were provided to patient.     Rupa Lagan Medina-Vargas, NP   10/24/2022   Nurse Notes:  Needs Shingles vaccine

## 2023-03-16 ENCOUNTER — Emergency Department (HOSPITAL_COMMUNITY): Payer: Worker's Compensation

## 2023-03-16 ENCOUNTER — Encounter (HOSPITAL_COMMUNITY): Payer: Self-pay | Admitting: Emergency Medicine

## 2023-03-16 ENCOUNTER — Emergency Department (HOSPITAL_COMMUNITY)
Admission: EM | Admit: 2023-03-16 | Discharge: 2023-03-16 | Disposition: A | Discharge status: Home / Self Care | Payer: Worker's Compensation | Attending: Emergency Medicine | Admitting: Emergency Medicine

## 2023-03-16 DIAGNOSIS — S0101XA Laceration without foreign body of scalp, initial encounter: Secondary | ICD-10-CM | POA: Diagnosis not present

## 2023-03-16 DIAGNOSIS — Z79899 Other long term (current) drug therapy: Secondary | ICD-10-CM | POA: Insufficient documentation

## 2023-03-16 DIAGNOSIS — I10 Essential (primary) hypertension: Secondary | ICD-10-CM | POA: Insufficient documentation

## 2023-03-16 DIAGNOSIS — W01198A Fall on same level from slipping, tripping and stumbling with subsequent striking against other object, initial encounter: Secondary | ICD-10-CM | POA: Diagnosis not present

## 2023-03-16 DIAGNOSIS — S0990XA Unspecified injury of head, initial encounter: Secondary | ICD-10-CM

## 2023-03-16 DIAGNOSIS — Z23 Encounter for immunization: Secondary | ICD-10-CM | POA: Insufficient documentation

## 2023-03-16 DIAGNOSIS — W19XXXA Unspecified fall, initial encounter: Secondary | ICD-10-CM

## 2023-03-16 MED ORDER — TETANUS-DIPHTH-ACELL PERTUSSIS 5-2.5-18.5 LF-MCG/0.5 IM SUSY
0.5000 mL | PREFILLED_SYRINGE | Freq: Once | INTRAMUSCULAR | Status: AC
  Administered 2023-03-16: 0.5 mL via INTRAMUSCULAR
  Filled 2023-03-16: qty 0.5

## 2023-03-16 NOTE — ED Provider Notes (Signed)
[facility] AT [facility] Provider Note   CSN: [id number] Arrival date & time: 03/16/23  1652     History  Chief Complaint  Patient presents with   Wayne Morgan is a 72 y.o. male with history of gout and HTN who presents to the ER complaining of a head injury. Pt works as a Electrical engineer at [employer] and was checking that a door was secured, when he slipped on wet floor, fell back and struck his head on the ground. No LOC. Not on blood thinners. No other trauma noted. Unknown last tetanus.    Fall       Home Medications Prior to Admission medications   Medication Sig Start Date End Date Taking? Authorizing Provider  acetaminophen (TYLENOL) 500 MG tablet Take 1 tablet (500 mg total) by mouth every 8 (eight) hours as needed. 02/16/21   Ngetich, Dinah C, NP  Cyanocobalamin (VITAMIN B12 PO) Take 1 capsule by mouth daily at 2 am.    [provider]  hydrALAZINE (APRESOLINE) 25 MG tablet TAKE 1 TABLET BY MOUTH THREE TIMES A DAY 01/12/21   Ngetich, Dinah C, NP  ibuprofen (ADVIL) 600 MG tablet Take 1 tablet (600 mg total) by mouth every 8 (eight) hours as needed. 08/18/20   Ngetich, Dinah C, NP  melatonin 3 MG TABS tablet Take 3 mg by mouth at bedtime. May take up to 6mg .    [provider]  metoprolol succinate (TOPROL-XL) 25 MG 24 hr tablet TAKE 1 TABLET (25 MG TOTAL) BY MOUTH DAILY. 12/28/21   Ngetich, Dinah C, NP  Multiple Vitamin (MULTIVITAMIN ADULT PO) Take 1 capsule by mouth daily.    [provider]  VITAMIN D PO Take 1 capsule by mouth daily.    [provider]      Allergies    Shellfish allergy    Review of Systems   Review of Systems  Skin:  Positive for wound.  All other systems reviewed and are negative.   Physical Exam Updated Vital Signs BP (!) 164/90   Pulse 74   Temp 98.9 F (37.2 C) (Oral)   Resp 18   SpO2 99%  Physical Exam Vitals and nursing note reviewed.  Constitutional:       Appearance: Normal appearance.  HENT:     Head: Normocephalic.      Comments: Approximately 4.5 cm laceration to the posterior scalp Eyes:     Conjunctiva/sclera: Conjunctivae normal.  Neck:     Comments: No midline spinal tenderness or deformities Pulmonary:     Effort: Pulmonary effort is normal. No respiratory distress.  Skin:    General: Skin is warm and dry.  Neurological:     Mental Status: He is alert.  Psychiatric:        Mood and Affect: Mood normal.        Behavior: Behavior normal.     ED Results / Procedures / Treatments   Labs (all labs ordered are listed, but only abnormal results are displayed) Labs Reviewed - No data to display  EKG None  Radiology CT Head Wo Contrast  Result Date: 03/16/2023 CLINICAL DATA:  Head trauma, minor (Age >= 65y); Neck trauma (Age >= 65y) EXAM: CT HEAD WITHOUT CONTRAST CT CERVICAL SPINE WITHOUT CONTRAST TECHNIQUE: Multidetector CT imaging of the head and cervical spine was performed following the standard protocol without intravenous contrast. Multiplanar CT image reconstructions of the cervical spine were also generated. RADIATION  DOSE REDUCTION: This exam was performed according to the departmental dose-optimization program which includes automated exposure control, adjustment of the mA and/or kV according to patient size and/or use of iterative reconstruction technique. COMPARISON:  None Available. FINDINGS: CT HEAD FINDINGS Brain: Normal anatomic configuration. Parenchymal volume loss is commensurate with the patient's age. Moderate periventricular white matter changes are present likely reflecting the sequela of small vessel ischemia. No abnormal intra or extra-axial mass lesion or fluid collection. No abnormal mass effect or midline shift. No evidence of acute intracranial hemorrhage or infarct. Ventricular size is normal. Cerebellum unremarkable. Vascular: No asymmetric hyperdense vasculature at the skull base. Skull: Intact  Sinuses/Orbits: Moderate mucosal thickening within the visualized left maxillary sinus. Remaining paranasal sinuses are clear. Orbits are unremarkable. Other: Mastoid air cells and middle ear cavities are clear. Scalp laceration noted within the right occipital scalp CT CERVICAL SPINE FINDINGS Alignment: Normal. Skull base and vertebrae: No acute fracture. No primary bone lesion or focal pathologic process. Soft tissues and spinal canal: No prevertebral fluid or swelling. No visible canal hematoma. Disc levels: Intervertebral disc spaces are preserved. Prevertebral soft tissues are not thickened on sagittal reformats. Spinal canal is widely patent. No significant neuroforaminal narrowing. Upper chest: Negative. Other: None IMPRESSION: 1. No acute intracranial abnormality. No calvarial fracture. Right occipital scalp laceration. 2. No acute fracture or listhesis of the cervical spine. Electronically Signed   By: Helyn Numbers M.D.   On: 03/16/2023 18:36   CT Cervical Spine Wo Contrast  Result Date: 03/16/2023 CLINICAL DATA:  Head trauma, minor (Age >= 65y); Neck trauma (Age >= 65y) EXAM: CT HEAD WITHOUT CONTRAST CT CERVICAL SPINE WITHOUT CONTRAST TECHNIQUE: Multidetector CT imaging of the head and cervical spine was performed following the standard protocol without intravenous contrast. Multiplanar CT image reconstructions of the cervical spine were also generated. RADIATION DOSE REDUCTION: This exam was performed according to the departmental dose-optimization program which includes automated exposure control, adjustment of the mA and/or kV according to patient size and/or use of iterative reconstruction technique. COMPARISON:  None Available. FINDINGS: CT HEAD FINDINGS Brain: Normal anatomic configuration. Parenchymal volume loss is commensurate with the patient's age. Moderate periventricular white matter changes are present likely reflecting the sequela of small vessel ischemia. No abnormal intra or  extra-axial mass lesion or fluid collection. No abnormal mass effect or midline shift. No evidence of acute intracranial hemorrhage or infarct. Ventricular size is normal. Cerebellum unremarkable. Vascular: No asymmetric hyperdense vasculature at the skull base. Skull: Intact Sinuses/Orbits: Moderate mucosal thickening within the visualized left maxillary sinus. Remaining paranasal sinuses are clear. Orbits are unremarkable. Other: Mastoid air cells and middle ear cavities are clear. Scalp laceration noted within the right occipital scalp CT CERVICAL SPINE FINDINGS Alignment: Normal. Skull base and vertebrae: No acute fracture. No primary bone lesion or focal pathologic process. Soft tissues and spinal canal: No prevertebral fluid or swelling. No visible canal hematoma. Disc levels: Intervertebral disc spaces are preserved. Prevertebral soft tissues are not thickened on sagittal reformats. Spinal canal is widely patent. No significant neuroforaminal narrowing. Upper chest: Negative. Other: None IMPRESSION: 1. No acute intracranial abnormality. No calvarial fracture. Right occipital scalp laceration. 2. No acute fracture or listhesis of the cervical spine. Electronically Signed   By: Helyn Numbers M.D.   On: 03/16/2023 18:36    Procedures .Marland KitchenLaceration Repair  Date/Time: 03/16/2023 7:11 PM  Performed by: Su Monks, PA-C Authorized by: Su Monks, PA-C   Consent:    Consent obtained:  Verbal  Consent given by:  Patient   Risks, benefits, and alternatives were discussed: yes     Risks discussed:  Infection, pain and poor wound healing Universal protocol:    Procedure explained and questions answered to patient or proxy's satisfaction: yes     Patient identity confirmed:  Provided demographic data Anesthesia:    Anesthesia method:  None Laceration details:    Location:  Scalp   Scalp location:  Crown   Length (cm):  4.5   Depth (mm):  2 Exploration:    Limited defect created  (wound extended): no     Hemostasis achieved with:  Direct pressure   Imaging obtained comment:  CT   Imaging outcome: foreign body not noted     Wound exploration: wound explored through full range of motion     Wound extent: no foreign body     Contaminated: no   Treatment:    Wound cleansed with: hydrogen peroxide.   Amount of cleaning:  Standard   Visualized foreign bodies/material removed: no     Debridement:  None   Undermining:  None   Scar revision: no   Skin repair:    Repair method:  Staples   Number of staples:  6 Approximation:    Approximation:  Close Repair type:    Repair type:  Simple Post-procedure details:    Dressing:  Non-adherent dressing   Procedure completion:  Tolerated well, no immediate complications     Medications Ordered in ED Medications  Tdap (BOOSTRIX) injection 0.5 mL (0.5 mLs Intramuscular Given 03/16/23 1736)    ED Course/ Medical Decision Making/ A&P                                 Medical Decision Making Amount and/or Complexity of Data Reviewed Radiology: ordered.  Risk Prescription drug management.   Patient is a 72 y.o. male who presents to the emergency department with concern for head injury with wound after mechanical fall. Wound occurred < 8hrs prior to ER arrival.   Physical exam: 4.5 cm laceration to the posterior scalp  Imaging: CT head and CT cervical spine show no acute traumatic findings  Procedure: Wound explored and base of wound visualized in a bloodless field without evidence of foreign body. Laceration was closed with staples. Patient tolerated procedure well with no immediate complications. Tdap was updated.   Disposition: Patient has  no comorbidities to effect normal wound healing. Patient discharged  without antibiotics.  Discussed suture home care with patient and answered questions. Patient to follow-up for wound check and suture removal in 7 days; they are to return to the ED sooner for signs of  infection. Pt is hemodynamically stable with no complaints prior to discharge.  Final Clinical Impression(s) / ED Diagnoses Final diagnoses:  Fall, initial encounter  Laceration of scalp, initial encounter  Injury of head, initial encounter    Rx / DC Orders ED Discharge Orders     None      Portions of this report may have been transcribed using voice recognition software. Every effort was made to ensure accuracy; however, inadvertent computerized transcription errors may be present.    Jeanella Flattery 03/16/23 1912    Linwood Dibbles, MD 03/17/23 [phone]

## 2023-03-16 NOTE — ED Triage Notes (Signed)
Pt here from work with c/o slip and fall on a wet floor hitting that back of his head , bleeding controlled , no loc

## 2023-03-16 NOTE — Discharge Instructions (Addendum)
You were seen in the emergency department for fall with head injury.   We have closed your laceration(s) with staples. These need to be removed in 7 days. This can be done at any doctor's office, urgent care, or emergency department.   If any of the staples come out before it is time for removal, that is okay. Make sure to keep the area as clean and dry as possible. You can let warm soapy warm run over the area, but do NOT scrub it.   Watch out for signs of infection, like we discussed, including: increased redness, tenderness, or drainage of pus from the area. If this happens and you have not been prescribed an antibiotic, please seek medical attention for possible infection.   You can take over the counter pain medicine like ibuprofen or tylenol as needed.

## 2023-03-16 NOTE — ED Notes (Signed)
Patient transported to CT 

## 2023-03-18 ENCOUNTER — Telehealth: Payer: Self-pay

## 2023-03-18 NOTE — Transitions of Care (Post Inpatient/ED Visit) (Unsigned)
03/18/2023  Name: Wayne Morgan MRN: [id number] DOB: [birth year]  Today's TOC FU Call Status: Today's TOC FU Call Status:: Unsuccessful Call (1st Attempt) Unsuccessful Call (1st Attempt) Date: 03/18/23  Attempted to reach the patient regarding the most recent Inpatient/ED visit. Called patient and no answer. Voicemail was left with office call back number.    Follow Up Plan: Additional outreach attempts will be made to reach the patient to complete the Transitions of Care (Post Inpatient/ED visit) call.   Signature : Sharde Gover.D/RMA

## 2023-03-20 NOTE — Transitions of Care (Post Inpatient/ED Visit) (Signed)
03/20/2023  Name: Wayne Morgan MRN: [id number] DOB: 10-Jan-1951  Today's TOC FU Call Status: Today's TOC FU Call Status:: Successful TOC FU Call Completed Unsuccessful Call (1st Attempt) Date: 03/18/23 [facility] FU Call Complete Date: 03/20/23 Patient's Name and Date of Birth confirmed.  Transition Care Management Follow-up Telephone Call Date of Discharge: 03/16/23 Discharge Facility: [facility]) Type of Discharge: Emergency Department Reason for ED Visit: Other: (Fall) How have you been since you were released from the hospital?: Same Any questions or concerns?: Yes Patient Questions/Concerns:: Patient will save questions for appointment.  Items Reviewed: Did you receive and understand the discharge instructions provided?: Yes Medications obtained,verified, and reconciled?: Yes (Medications Reviewed) Any new allergies since your discharge?: No Dietary orders reviewed?: No Do you have support at home?: Yes  Medications Reviewed Today: Medications Reviewed Today     Reviewed by Dicky Doe, CMA (Certified Medical Assistant) on 03/20/23 at [phone]  Med List Status: <None>   Medication Order Taking? Sig Documenting Provider Last Dose Status Informant  acetaminophen (TYLENOL) 500 MG tablet [id number] Yes Take 1 tablet (500 mg total) by mouth every 8 (eight) hours as needed. Ngetich, Dinah C, NP Taking Active   Cyanocobalamin (VITAMIN B12 PO) [id number] Yes Take 1 capsule by mouth daily at 2 am. [provider] Taking Active   hydrALAZINE (APRESOLINE) 25 MG tablet [id number] Yes TAKE 1 TABLET BY MOUTH THREE TIMES A DAY Ngetich, Dinah C, NP Taking Active   ibuprofen (ADVIL) 600 MG tablet [id number] Yes Take 1 tablet (600 mg total) by mouth every 8 (eight) hours as needed. Ngetich, Dinah C, NP Taking Active   lisinopril (ZESTRIL) 40 MG tablet [id number] Yes Take 40 mg by mouth every morning. [provider] Taking Active   melatonin 3 MG TABS tablet [id number] Yes  Take 3 mg by mouth at bedtime. May take up to 6mg . [provider] Taking Active   metoprolol succinate (TOPROL-XL) 25 MG 24 hr tablet [id number] Yes TAKE 1 TABLET (25 MG TOTAL) BY MOUTH DAILY. Ngetich, Donalee Citrin, NP Taking Active   Multiple Vitamin (MULTIVITAMIN ADULT PO) [id number] Yes Take 1 capsule by mouth daily. [provider] Taking Active   VITAMIN D PO [id number] Yes Take 1 capsule by mouth daily. [provider] Taking Active             Home Care and Equipment/Supplies: Were Home Health Services Ordered?: No Any new equipment or medical supplies ordered?: No  Functional Questionnaire: Do you need assistance with bathing/showering or dressing?: No Do you need assistance with meal preparation?: No Do you need assistance with eating?: No Do you have difficulty maintaining continence: No Do you need assistance with getting out of bed/getting out of a chair/moving?: No Do you have difficulty managing or taking your medications?: No  Follow up appointments reviewed: PCP Follow-up appointment confirmed?: Yes Date of PCP follow-up appointment?: 03/25/23 Follow-up Provider: Richarda Blade, NP Specialist [facility] Follow-up appointment confirmed?: No Do you need transportation to your follow-up appointment?: No Do you understand care options if your condition(s) worsen?: Yes-patient verbalized understanding    SIGNATURE: Habeeb Puertas.D/RMA

## 2023-03-25 ENCOUNTER — Encounter: Payer: Self-pay | Admitting: Family

## 2023-03-25 ENCOUNTER — Ambulatory Visit (INDEPENDENT_AMBULATORY_CARE_PROVIDER_SITE_OTHER): Payer: Medicare HMO | Admitting: Family

## 2023-03-25 VITALS — BP 154/100 | HR 53 | Temp 97.6°F | Resp 16 | Ht 70.0 in | Wt 188.4 lb

## 2023-03-25 DIAGNOSIS — I739 Peripheral vascular disease, unspecified: Secondary | ICD-10-CM

## 2023-03-25 DIAGNOSIS — S0101XD Laceration without foreign body of scalp, subsequent encounter: Secondary | ICD-10-CM

## 2023-03-25 DIAGNOSIS — I10 Essential (primary) hypertension: Secondary | ICD-10-CM

## 2023-03-25 DIAGNOSIS — F321 Major depressive disorder, single episode, moderate: Secondary | ICD-10-CM | POA: Diagnosis not present

## 2023-03-25 NOTE — Progress Notes (Signed)
Provider: Richarda Blade FNP-C  Ledon Weihe, Donalee Citrin, NP  Patient Care Team: Markeith Jue, Donalee Citrin, NP as PCP - General (Family Medicine)  Extended Emergency Contact Information Primary Emergency Contact: [facility] Address: [address]          [place],  [address] Home Phone: [phone] Mobile Phone: [phone] Relation: None Secondary Emergency Contact: Trentman,Ronnie Home Phone: [phone] Relation: Other  Code Status:  Full Code  Goals of care: Advanced Directive information    03/25/2023    1:11 PM  Advanced Directives  Does Patient Have a Medical Advance Directive? Yes  Type of Estate agent of [place];Living will  Does patient want to make changes to medical advance directive? Yes (Inpatient - patient defers changing a medical advance directive at this time - Information given)  Copy of Healthcare Power of Attorney in Chart? Yes - validated most recent copy scanned in chart (See row information)     Chief Complaint  Patient presents with   Transitions Of Care    HPI:  Pt is a 72 y.o. male seen today for an acute visit for transition of care post ED visit on 03/16/2023 for head injury post fall.He works at [employer] and was checking that a door was secured when he slipped on a wet floor.He fell back and struck his head on the ground.He did not loss his consciousness.Not on any anticoagulant or antiplatelets.He was noted to have a 4.5 cm laceration on crown scalp 6 staples placed. CT of the head showed no acute abnormalities.He was advised to follow up with PCP for staples removal in 7 days.  He denies any headache,dizziness,fever or chills.Also states no drainage.  States had a colonoscopy on the 03/14/2023 states had 5 polyps removed.    Past Medical History:  Diagnosis Date   Gout    Hypertension    Past Surgical History:  Procedure Laterality Date   OTHER SURGICAL HISTORY  1960   Hernia     Allergies  Allergen Reactions    Shellfish Allergy     Outpatient Encounter Medications as of 03/25/2023  Medication Sig   acetaminophen (TYLENOL) 500 MG tablet Take 1 tablet (500 mg total) by mouth every 8 (eight) hours as needed.   Cyanocobalamin (VITAMIN B12 PO) Take 1 capsule by mouth daily at 2 am.   hydrALAZINE (APRESOLINE) 25 MG tablet TAKE 1 TABLET BY MOUTH THREE TIMES A DAY   ibuprofen (ADVIL) 600 MG tablet Take 1 tablet (600 mg total) by mouth every 8 (eight) hours as needed.   lisinopril (ZESTRIL) 40 MG tablet Take 40 mg by mouth every morning.   melatonin 3 MG TABS tablet Take 3 mg by mouth at bedtime. May take up to 6mg .   metoprolol succinate (TOPROL-XL) 25 MG 24 hr tablet TAKE 1 TABLET (25 MG TOTAL) BY MOUTH DAILY.   Multiple Vitamin (MULTIVITAMIN ADULT PO) Take 1 capsule by mouth daily.   VITAMIN D PO Take 1 capsule by mouth daily.   No facility-administered encounter medications on file as of 03/25/2023.    Review of Systems  Constitutional:  Negative for appetite change, chills, fatigue, fever and unexpected weight change.  HENT:  Negative for congestion, dental problem, ear discharge, ear pain, facial swelling, hearing loss, nosebleeds, postnasal drip, rhinorrhea, sinus pressure, sinus pain, sneezing, sore throat, tinnitus and trouble swallowing.   Eyes:  Negative for pain, discharge, redness, itching and visual disturbance.  Respiratory:  Negative for cough, chest tightness, shortness of breath and wheezing.  Cardiovascular:  Negative for chest pain, palpitations and leg swelling.  Gastrointestinal:  Negative for abdominal distention, abdominal pain, blood in stool, constipation, diarrhea, nausea and vomiting.  Endocrine: Negative for cold intolerance, heat intolerance, polydipsia, polyphagia and polyuria.  Genitourinary:  Negative for difficulty urinating, dysuria, flank pain, frequency and urgency.  Musculoskeletal:  Negative for arthralgias, back pain, gait problem, joint swelling, myalgias, neck  pain and neck stiffness.  Skin:  Negative for color change, pallor, rash and wound.       Staples x 6 to posterior head   Neurological:  Negative for dizziness, syncope, speech difficulty, weakness, light-headedness, numbness and headaches.  Hematological:  Does not bruise/bleed easily.  Psychiatric/Behavioral:  Negative for agitation, behavioral problems, confusion, hallucinations, self-injury, sleep disturbance and suicidal ideas. The patient is not nervous/anxious.     Immunization History  Administered Date(s) Administered   Fluad Quad(high Dose 65+) 05/26/2022   Influenza,inj,Quad PF,6+ Mos 04/27/2016, 04/26/2017, 06/19/2018   Influenza-Unspecified 05/13/2015, 04/26/2019, 04/14/2020, 03/25/2021   PFIZER(Purple Top)SARS-COV-2 Vaccination 08/17/2019, 09/07/2019, 04/14/2020   PPD Test 09/08/2003   Pfizer(Comirnaty)Fall Seasonal Vaccine 12 years and older 05/26/2022   Pneumococcal Polysaccharide-23 04/27/2016, 05/26/2022   Td 05/06/2003   Td (Adult), 2 Lf Tetanus Toxid, Preservative Free 05/06/2003   Tdap 05/05/2013, 10/06/2015, 03/16/2023   Pertinent  Health Maintenance Due  Topic Date Due   Colonoscopy  Never done   INFLUENZA VACCINE  01/24/2023      03/13/2021    4:08 PM 03/23/2022    3:01 PM 03/23/2022    3:05 PM 10/24/2022   10:09 AM 03/25/2023    1:11 PM  Fall Risk  Falls in the past year? 0 0 0 0 1  Was there an injury with Fall? 0 0 0 0 1  Fall Risk Category Calculator 0 0 0 0 2  Fall Risk Category (Retired) Low Low Low    (RETIRED) Patient Fall Risk Level Low fall risk Low fall risk Low fall risk    Patient at Risk for Falls Due to No Fall Risks  History of fall(s) No Fall Risks   Fall risk Follow up   Falls evaluation completed Falls evaluation completed    Functional Status Survey:    Vitals:   03/25/23 1312 03/25/23 1315  BP: (!) 150/90 (!) 154/100  Pulse: (!) 53   Resp: 16   Temp: 97.6 F (36.4 C)   SpO2: 99%   Weight: 188 lb 6.4 oz (85.5 kg)   Height: 5'  10" (1.778 m)    Body mass index is 27.03 kg/m. Physical Exam Vitals reviewed.  Constitutional:      General: He is not in acute distress.    Appearance: Normal appearance. He is overweight. He is not ill-appearing or diaphoretic.  HENT:     Head: Normocephalic.     Right Ear: Tympanic membrane, ear canal and external ear normal. There is no impacted cerumen.     Left Ear: Tympanic membrane, ear canal and external ear normal. There is no impacted cerumen.     Nose: Nose normal. No congestion or rhinorrhea.     Mouth/Throat:     Mouth: Mucous membranes are moist.     Pharynx: Oropharynx is clear. No oropharyngeal exudate or posterior oropharyngeal erythema.  Eyes:     General: No scleral icterus.       Right eye: No discharge.        Left eye: No discharge.     Extraocular Movements: Extraocular movements intact.  Conjunctiva/sclera: Conjunctivae normal.     Pupils: Pupils are equal, round, and reactive to light.  Neck:     Vascular: No carotid bruit.  Cardiovascular:     Rate and Rhythm: Normal rate and regular rhythm.     Pulses: Normal pulses.     Heart sounds: Normal heart sounds. No murmur heard.    No friction rub. No gallop.  Pulmonary:     Effort: Pulmonary effort is normal. No respiratory distress.     Breath sounds: Normal breath sounds. No wheezing, rhonchi or rales.  Chest:     Chest wall: No tenderness.  Abdominal:     General: Bowel sounds are normal. There is no distension.     Palpations: Abdomen is soft. There is no mass.     Tenderness: There is no abdominal tenderness. There is no right CVA tenderness, left CVA tenderness, guarding or rebound.  Musculoskeletal:        General: No swelling or tenderness. Normal range of motion.     Cervical back: Normal range of motion. No rigidity or tenderness.     Right lower leg: No edema.     Left lower leg: No edema.  Lymphadenopathy:     Cervical: No cervical adenopathy.  Skin:    General: Skin is warm and  dry.     Coloration: Skin is not pale.     Findings: No bruising, erythema, lesion or rash.       Neurological:     Mental Status: He is alert and oriented to person, place, and time.     Cranial Nerves: No cranial nerve deficit.     Sensory: No sensory deficit.     Motor: No weakness.     Coordination: Coordination normal.     Gait: Gait normal.  Psychiatric:        Mood and Affect: Mood normal.        Speech: Speech normal.        Behavior: Behavior normal.    Labs reviewed: No results for input(s): "NA", "K", "CL", "CO2", "GLUCOSE", "BUN", "CREATININE", "CALCIUM", "MG", "PHOS" in the last 8760 hours. No results for input(s): "AST", "ALT", "ALKPHOS", "BILITOT", "PROT", "ALBUMIN" in the last 8760 hours. No results for input(s): "WBC", "NEUTROABS", "HGB", "HCT", "MCV", "PLT" in the last 8760 hours. Lab Results  Component Value Date   TSH 1.47 08/02/2020   No results found for: "HGBA1C" Lab Results  Component Value Date   CHOL 185 08/02/2020   HDL 31 (L) 08/02/2020   LDLCALC 128 (H) 08/02/2020   TRIG 151 (H) 08/02/2020   CHOLHDL 6.0 (H) 08/02/2020    Significant Diagnostic Results in last 30 days:  CT Head Wo Contrast  Result Date: 03/16/2023 CLINICAL DATA:  Head trauma, minor (Age >= 65y); Neck trauma (Age >= 65y) EXAM: CT HEAD WITHOUT CONTRAST CT CERVICAL SPINE WITHOUT CONTRAST TECHNIQUE: Multidetector CT imaging of the head and cervical spine was performed following the standard protocol without intravenous contrast. Multiplanar CT image reconstructions of the cervical spine were also generated. RADIATION DOSE REDUCTION: This exam was performed according to the departmental dose-optimization program which includes automated exposure control, adjustment of the mA and/or kV according to patient size and/or use of iterative reconstruction technique. COMPARISON:  None Available. FINDINGS: CT HEAD FINDINGS Brain: Normal anatomic configuration. Parenchymal volume loss is  commensurate with the patient's age. Moderate periventricular white matter changes are present likely reflecting the sequela of small vessel ischemia. No abnormal intra or extra-axial mass lesion  or fluid collection. No abnormal mass effect or midline shift. No evidence of acute intracranial hemorrhage or infarct. Ventricular size is normal. Cerebellum unremarkable. Vascular: No asymmetric hyperdense vasculature at the skull base. Skull: Intact Sinuses/Orbits: Moderate mucosal thickening within the visualized left maxillary sinus. Remaining paranasal sinuses are clear. Orbits are unremarkable. Other: Mastoid air cells and middle ear cavities are clear. Scalp laceration noted within the right occipital scalp CT CERVICAL SPINE FINDINGS Alignment: Normal. Skull base and vertebrae: No acute fracture. No primary bone lesion or focal pathologic process. Soft tissues and spinal canal: No prevertebral fluid or swelling. No visible canal hematoma. Disc levels: Intervertebral disc spaces are preserved. Prevertebral soft tissues are not thickened on sagittal reformats. Spinal canal is widely patent. No significant neuroforaminal narrowing. Upper chest: Negative. Other: None IMPRESSION: 1. No acute intracranial abnormality. No calvarial fracture. Right occipital scalp laceration. 2. No acute fracture or listhesis of the cervical spine. Electronically Signed   By: Helyn Numbers M.D.   On: 03/16/2023 18:36   CT Cervical Spine Wo Contrast  Result Date: 03/16/2023 CLINICAL DATA:  Head trauma, minor (Age >= 65y); Neck trauma (Age >= 65y) EXAM: CT HEAD WITHOUT CONTRAST CT CERVICAL SPINE WITHOUT CONTRAST TECHNIQUE: Multidetector CT imaging of the head and cervical spine was performed following the standard protocol without intravenous contrast. Multiplanar CT image reconstructions of the cervical spine were also generated. RADIATION DOSE REDUCTION: This exam was performed according to the departmental dose-optimization program  which includes automated exposure control, adjustment of the mA and/or kV according to patient size and/or use of iterative reconstruction technique. COMPARISON:  None Available. FINDINGS: CT HEAD FINDINGS Brain: Normal anatomic configuration. Parenchymal volume loss is commensurate with the patient's age. Moderate periventricular white matter changes are present likely reflecting the sequela of small vessel ischemia. No abnormal intra or extra-axial mass lesion or fluid collection. No abnormal mass effect or midline shift. No evidence of acute intracranial hemorrhage or infarct. Ventricular size is normal. Cerebellum unremarkable. Vascular: No asymmetric hyperdense vasculature at the skull base. Skull: Intact Sinuses/Orbits: Moderate mucosal thickening within the visualized left maxillary sinus. Remaining paranasal sinuses are clear. Orbits are unremarkable. Other: Mastoid air cells and middle ear cavities are clear. Scalp laceration noted within the right occipital scalp CT CERVICAL SPINE FINDINGS Alignment: Normal. Skull base and vertebrae: No acute fracture. No primary bone lesion or focal pathologic process. Soft tissues and spinal canal: No prevertebral fluid or swelling. No visible canal hematoma. Disc levels: Intervertebral disc spaces are preserved. Prevertebral soft tissues are not thickened on sagittal reformats. Spinal canal is widely patent. No significant neuroforaminal narrowing. Upper chest: Negative. Other: None IMPRESSION: 1. No acute intracranial abnormality. No calvarial fracture. Right occipital scalp laceration. 2. No acute fracture or listhesis of the cervical spine. Electronically Signed   By: Helyn Numbers M.D.   On: 03/16/2023 18:36    Assessment/Plan  1. Laceration of scalp without foreign body, subsequent encounter Afebrile  -  staples X 6 intact removed with staple remover.Tolerated procedure well.No erythema,bleeding or discharge noted.laceration site well approximated and  healing well.left to open air.  -Advised to notify provider for any symptoms of infection  2. Current moderate episode of major depressive disorder, unspecified whether recurrent (HCC) Mood stable -Continue to monitor  3. PVD (peripheral vascular disease) (HCC) No ulceration -Continue to control high risk factors  4. Uncontrolled hypertension Blood pressure elevated though anxious due to removal of staples -Will have him monitor blood pressure at home and notify provider if  greater than 140/90 -Follow-up in 2 weeks to recheck blood pressure -Continue on metoprolol, lisinopril and hydralazine  Family/ staff Communication: Reviewed plan of care with patient verbalized understanding  Labs/tests ordered: None   Next Appointment: Return in about 2 weeks (around 04/08/2023) for Blood pressure.   Caesar Bookman, NP

## 2023-03-25 NOTE — Patient Instructions (Signed)
Notify provider for any signs of infection.

## 2023-03-28 ENCOUNTER — Encounter: Payer: Medicare HMO | Admitting: Family

## 2023-03-31 DIAGNOSIS — I739 Peripheral vascular disease, unspecified: Secondary | ICD-10-CM | POA: Insufficient documentation

## 2023-03-31 DIAGNOSIS — F321 Major depressive disorder, single episode, moderate: Secondary | ICD-10-CM | POA: Insufficient documentation

## 2023-03-31 NOTE — Progress Notes (Signed)
  This encounter was created in error - please disregard. No show 

## 2023-04-08 ENCOUNTER — Encounter: Payer: Medicare HMO | Admitting: Family

## 2023-04-11 ENCOUNTER — Ambulatory Visit (INDEPENDENT_AMBULATORY_CARE_PROVIDER_SITE_OTHER): Payer: Medicare HMO | Admitting: Family

## 2023-04-11 ENCOUNTER — Encounter: Payer: Self-pay | Admitting: Family

## 2023-04-11 VITALS — BP 140/90 | HR 62 | Temp 97.8°F | Resp 18 | Ht 70.0 in | Wt 184.8 lb

## 2023-04-11 DIAGNOSIS — I10 Essential (primary) hypertension: Secondary | ICD-10-CM | POA: Diagnosis not present

## 2023-04-11 MED ORDER — AMLODIPINE BESYLATE 5 MG PO TABS: 5.0000 mg | ORAL_TABLET | Freq: Every day | ORAL | 1 refills | Status: DC

## 2023-04-11 NOTE — Patient Instructions (Signed)
-   Depression way out  by Dr.Neil Nedley  - SOS for emotion: managing anxiety and depression  - Telling yourself the truth

## 2023-04-11 NOTE — Progress Notes (Signed)
Provider: Richarda Blade FNP-C  Phong Isenberg, Donalee Citrin, NP  Patient Care Team: Adelene Polivka, Donalee Citrin, NP as PCP - General (Family Medicine)  Extended Emergency Contact Information Primary Emergency Contact: [facility] Address: [address]          [place],  [address] Home Phone: [phone] Mobile Phone: [phone] Relation: None Secondary Emergency Contact: Cino,Ronnie Home Phone: [phone] Relation: Other  Code Status:  Full Code  Goals of care: Advanced Directive information    04/11/2023   10:42 AM  Advanced Directives  Does Patient Have a Medical Advance Directive? Yes  Type of Advance Directive Living will  Does patient want to make changes to medical advance directive? No - Patient declined     Chief Complaint  Patient presents with   Acute Visit    2 weeks follow up blood pressure     HPI:  Pt is a 72 y.o. male seen today for an acute visit for 2 weeks follow up for high blood pressure.His B/p was 154/100 post hospital visit for fall sustained laceration on scalp.staples x 6 were removed last visit.He was advised to check B/p at home then follow up for re-evaluation. Blood pressure still elevated this visit. He denies any headache,dizziness,vision changes,fatigue,chest tightness,palpitation,chest pain or shortness of breath.      Past Medical History:  Diagnosis Date   Gout    Hypertension    Past Surgical History:  Procedure Laterality Date   OTHER SURGICAL HISTORY  1960   Hernia     Allergies  Allergen Reactions   Shellfish Allergy     Outpatient Encounter Medications as of 04/11/2023  Medication Sig   acetaminophen (TYLENOL) 500 MG tablet Take 1 tablet (500 mg total) by mouth every 8 (eight) hours as needed.   Cyanocobalamin (VITAMIN B12 PO) Take 1 capsule by mouth daily at 2 am.   hydrALAZINE (APRESOLINE) 25 MG tablet TAKE 1 TABLET BY MOUTH THREE TIMES A DAY   lisinopril (ZESTRIL) 40 MG tablet Take 40 mg by mouth every morning.    melatonin 3 MG TABS tablet Take 3 mg by mouth at bedtime. May take up to 6mg .   meloxicam (MOBIC) 15 MG tablet Take 15 mg by mouth daily as needed for pain.   metoprolol succinate (TOPROL-XL) 25 MG 24 hr tablet TAKE 1 TABLET (25 MG TOTAL) BY MOUTH DAILY.   Multiple Vitamin (MULTIVITAMIN ADULT PO) Take 1 capsule by mouth daily.   VITAMIN D PO Take 1 capsule by mouth daily.   No facility-administered encounter medications on file as of 04/11/2023.    Review of Systems  Constitutional:  Negative for appetite change, chills, fatigue, fever and unexpected weight change.  HENT:  Negative for congestion, dental problem, ear discharge, ear pain, facial swelling, hearing loss, nosebleeds, postnasal drip, rhinorrhea, sinus pressure, sinus pain, sneezing and sore throat.   Eyes:  Negative for pain, discharge, redness, itching and visual disturbance.  Respiratory:  Negative for cough, chest tightness, shortness of breath and wheezing.   Cardiovascular:  Negative for chest pain, palpitations and leg swelling.  Gastrointestinal:  Negative for abdominal distention, abdominal pain, diarrhea, nausea and vomiting.  Musculoskeletal:  Negative for arthralgias, back pain, gait problem, joint swelling, myalgias, neck pain and neck stiffness.  Skin:  Negative for color change, pallor, rash and wound.  Neurological:  Negative for dizziness, weakness, light-headedness, numbness and headaches.    Immunization History  Administered Date(s) Administered   Fluad Quad(high Dose 65+) 05/26/2022   Influenza,inj,Quad PF,6+ Mos 04/27/2016, 04/26/2017,  06/19/2018   Influenza-Unspecified 05/13/2015, 04/26/2019, 04/14/2020, 03/25/2021   PFIZER(Purple Top)SARS-COV-2 Vaccination 08/17/2019, 09/07/2019, 04/14/2020   PPD Test 09/08/2003   Pfizer(Comirnaty)Fall Seasonal Vaccine 12 years and older 05/26/2022   Pneumococcal Polysaccharide-23 04/27/2016, 05/26/2022   Td 05/06/2003   Td (Adult), 2 Lf Tetanus Toxid, Preservative  Free 05/06/2003   Tdap 05/05/2013, 10/06/2015, 03/16/2023   Pertinent  Health Maintenance Due  Topic Date Due   Colonoscopy  Never done   INFLUENZA VACCINE  01/24/2023      03/23/2022    3:01 PM 03/23/2022    3:05 PM 10/24/2022   10:09 AM 03/25/2023    1:11 PM 04/11/2023   10:42 AM  Fall Risk  Falls in the past year? 0 0 0 1 1  Was there an injury with Fall? 0 0 0 1 1  Fall Risk Category Calculator 0 0 0 2 2  Fall Risk Category (Retired) Low Low     (RETIRED) Patient Fall Risk Level Low fall risk Low fall risk     Patient at Risk for Falls Due to  History of fall(s) No Fall Risks    Fall risk Follow up  Falls evaluation completed Falls evaluation completed     Functional Status Survey:    Vitals:   04/11/23 1043  BP: (!) 180/100  Pulse: 62  Resp: 18  Temp: 97.8 F (36.6 C)  SpO2: 99%  Weight: 184 lb 12.8 oz (83.8 kg)  Height: 5\' 10"  (1.778 m)   Body mass index is 26.52 kg/m. Physical Exam Vitals reviewed.  Constitutional:      General: He is not in acute distress.    Appearance: Normal appearance. He is overweight. He is not ill-appearing or diaphoretic.  HENT:     Head: Normocephalic.     Nose: Nose normal. No congestion or rhinorrhea.     Mouth/Throat:     Mouth: Mucous membranes are moist.     Pharynx: Oropharynx is clear. No oropharyngeal exudate or posterior oropharyngeal erythema.  Eyes:     General: No scleral icterus.       Right eye: No discharge.        Left eye: No discharge.     Extraocular Movements: Extraocular movements intact.     Conjunctiva/sclera: Conjunctivae normal.     Pupils: Pupils are equal, round, and reactive to light.  Neck:     Vascular: No carotid bruit.  Cardiovascular:     Rate and Rhythm: Normal rate and regular rhythm.     Pulses: Normal pulses.     Heart sounds: Normal heart sounds. No murmur heard.    No friction rub. No gallop.  Pulmonary:     Effort: Pulmonary effort is normal. No respiratory distress.     Breath  sounds: Normal breath sounds. No wheezing, rhonchi or rales.  Chest:     Chest wall: No tenderness.  Abdominal:     General: Bowel sounds are normal. There is no distension.     Palpations: Abdomen is soft. There is no mass.     Tenderness: There is no abdominal tenderness. There is no right CVA tenderness, left CVA tenderness, guarding or rebound.  Musculoskeletal:        General: No swelling or tenderness. Normal range of motion.     Cervical back: Normal range of motion. No rigidity or tenderness.     Right lower leg: No edema.     Left lower leg: No edema.  Lymphadenopathy:     Cervical: No cervical  adenopathy.  Skin:    General: Skin is warm and dry.     Coloration: Skin is not pale.     Findings: No erythema.  Neurological:     Mental Status: He is alert and oriented to person, place, and time.     Motor: No weakness.     Gait: Gait normal.  Psychiatric:        Mood and Affect: Mood normal.        Speech: Speech normal.        Behavior: Behavior normal.     Labs reviewed: No results for input(s): "NA", "K", "CL", "CO2", "GLUCOSE", "BUN", "CREATININE", "CALCIUM", "MG", "PHOS" in the last 8760 hours. No results for input(s): "AST", "ALT", "ALKPHOS", "BILITOT", "PROT", "ALBUMIN" in the last 8760 hours. No results for input(s): "WBC", "NEUTROABS", "HGB", "HCT", "MCV", "PLT" in the last 8760 hours. Lab Results  Component Value Date   TSH 1.47 08/02/2020   No results found for: "HGBA1C" Lab Results  Component Value Date   CHOL 185 08/02/2020   HDL 31 (L) 08/02/2020   LDLCALC 128 (H) 08/02/2020   TRIG 151 (H) 08/02/2020   CHOLHDL 6.0 (H) 08/02/2020    Significant Diagnostic Results in last 30 days:  CT Head Wo Contrast  Result Date: 03/16/2023 CLINICAL DATA:  Head trauma, minor (Age >= 65y); Neck trauma (Age >= 65y) EXAM: CT HEAD WITHOUT CONTRAST CT CERVICAL SPINE WITHOUT CONTRAST TECHNIQUE: Multidetector CT imaging of the head and cervical spine was performed  following the standard protocol without intravenous contrast. Multiplanar CT image reconstructions of the cervical spine were also generated. RADIATION DOSE REDUCTION: This exam was performed according to the departmental dose-optimization program which includes automated exposure control, adjustment of the mA and/or kV according to patient size and/or use of iterative reconstruction technique. COMPARISON:  None Available. FINDINGS: CT HEAD FINDINGS Brain: Normal anatomic configuration. Parenchymal volume loss is commensurate with the patient's age. Moderate periventricular white matter changes are present likely reflecting the sequela of small vessel ischemia. No abnormal intra or extra-axial mass lesion or fluid collection. No abnormal mass effect or midline shift. No evidence of acute intracranial hemorrhage or infarct. Ventricular size is normal. Cerebellum unremarkable. Vascular: No asymmetric hyperdense vasculature at the skull base. Skull: Intact Sinuses/Orbits: Moderate mucosal thickening within the visualized left maxillary sinus. Remaining paranasal sinuses are clear. Orbits are unremarkable. Other: Mastoid air cells and middle ear cavities are clear. Scalp laceration noted within the right occipital scalp CT CERVICAL SPINE FINDINGS Alignment: Normal. Skull base and vertebrae: No acute fracture. No primary bone lesion or focal pathologic process. Soft tissues and spinal canal: No prevertebral fluid or swelling. No visible canal hematoma. Disc levels: Intervertebral disc spaces are preserved. Prevertebral soft tissues are not thickened on sagittal reformats. Spinal canal is widely patent. No significant neuroforaminal narrowing. Upper chest: Negative. Other: None IMPRESSION: 1. No acute intracranial abnormality. No calvarial fracture. Right occipital scalp laceration. 2. No acute fracture or listhesis of the cervical spine. Electronically Signed   By: Helyn Numbers M.D.   On: 03/16/2023 18:36   CT  Cervical Spine Wo Contrast  Result Date: 03/16/2023 CLINICAL DATA:  Head trauma, minor (Age >= 65y); Neck trauma (Age >= 65y) EXAM: CT HEAD WITHOUT CONTRAST CT CERVICAL SPINE WITHOUT CONTRAST TECHNIQUE: Multidetector CT imaging of the head and cervical spine was performed following the standard protocol without intravenous contrast. Multiplanar CT image reconstructions of the cervical spine were also generated. RADIATION DOSE REDUCTION: This exam was performed according  to the departmental dose-optimization program which includes automated exposure control, adjustment of the mA and/or kV according to patient size and/or use of iterative reconstruction technique. COMPARISON:  None Available. FINDINGS: CT HEAD FINDINGS Brain: Normal anatomic configuration. Parenchymal volume loss is commensurate with the patient's age. Moderate periventricular white matter changes are present likely reflecting the sequela of small vessel ischemia. No abnormal intra or extra-axial mass lesion or fluid collection. No abnormal mass effect or midline shift. No evidence of acute intracranial hemorrhage or infarct. Ventricular size is normal. Cerebellum unremarkable. Vascular: No asymmetric hyperdense vasculature at the skull base. Skull: Intact Sinuses/Orbits: Moderate mucosal thickening within the visualized left maxillary sinus. Remaining paranasal sinuses are clear. Orbits are unremarkable. Other: Mastoid air cells and middle ear cavities are clear. Scalp laceration noted within the right occipital scalp CT CERVICAL SPINE FINDINGS Alignment: Normal. Skull base and vertebrae: No acute fracture. No primary bone lesion or focal pathologic process. Soft tissues and spinal canal: No prevertebral fluid or swelling. No visible canal hematoma. Disc levels: Intervertebral disc spaces are preserved. Prevertebral soft tissues are not thickened on sagittal reformats. Spinal canal is widely patent. No significant neuroforaminal narrowing. Upper  chest: Negative. Other: None IMPRESSION: 1. No acute intracranial abnormality. No calvarial fracture. Right occipital scalp laceration. 2. No acute fracture or listhesis of the cervical spine. Electronically Signed   By: Helyn Numbers M.D.   On: 03/16/2023 18:36    Assessment/Plan  Uncontrolled hypertension B/p still elevated this visit  - start on amlodipine as below.side effects discussed. -  Advised to check Blood pressure at home and record on log provided and notify provider if B/p > 140/90  - amLODipine (NORVASC) 5 MG tablet; Take 1 tablet (5 mg total) by mouth daily.  Dispense: 90 tablet; Refill: 1  Family/ staff Communication: Reviewed plan of care with patient verbalized understanding  Labs/tests ordered: None   Next Appointment: Return in about 1 month (around 05/12/2023) for medical mangement of chronic issues.Caesar Bookman, NP

## 2023-04-14 NOTE — Progress Notes (Signed)
  This encounter was created in error - please disregard. No show 

## 2023-05-13 ENCOUNTER — Encounter: Payer: Self-pay | Admitting: Adult Health

## 2023-05-13 ENCOUNTER — Ambulatory Visit (INDEPENDENT_AMBULATORY_CARE_PROVIDER_SITE_OTHER): Payer: Medicare HMO | Admitting: Adult Health

## 2023-05-13 VITALS — BP 121/78 | HR 65 | Temp 97.0°F | Resp 18 | Ht 70.0 in | Wt 194.2 lb

## 2023-05-13 DIAGNOSIS — I10 Essential (primary) hypertension: Secondary | ICD-10-CM

## 2023-05-13 NOTE — Progress Notes (Signed)
[facility] clinic  Provider:  Kenard Gower DNP  Code Status:  Full Code  Goals of Care:     04/11/2023   10:42 AM  Advanced Directives  Does Patient Have a Medical Advance Directive? Yes  Type of Advance Directive Living will  Does patient want to make changes to medical advance directive? No - Patient declined     Chief Complaint  Patient presents with   Follow-up     1 month follow-up   Immunizations    Shingrix and Pneumonia. [facility] verifed   Quality Metric Gaps    Colonoscopy     HPI: Patient is a 72 y.o. male seen today for a 1 month follow-up.  Primary hypertension -  BP121/78, takes amlodipine, hydralazine, lisinopril and metoprolol succinate  He walks 2 miles/day.  Past Medical History:  Diagnosis Date   Gout    Hypertension     Past Surgical History:  Procedure Laterality Date   OTHER SURGICAL HISTORY  1960   Hernia     Allergies  Allergen Reactions   Shellfish Allergy     Outpatient Encounter Medications as of 05/13/2023  Medication Sig   acetaminophen (TYLENOL) 500 MG tablet Take 1 tablet (500 mg total) by mouth every 8 (eight) hours as needed.   amLODipine (NORVASC) 5 MG tablet Take 1 tablet (5 mg total) by mouth daily.   Cyanocobalamin (VITAMIN B12 PO) Take 1 capsule by mouth daily at 2 am.   hydrALAZINE (APRESOLINE) 25 MG tablet TAKE 1 TABLET BY MOUTH THREE TIMES A DAY   lisinopril (ZESTRIL) 40 MG tablet Take 40 mg by mouth every morning.   melatonin 3 MG TABS tablet Take 3 mg by mouth at bedtime. May take up to 6mg .   meloxicam (MOBIC) 15 MG tablet Take 15 mg by mouth daily as needed for pain.   metoprolol succinate (TOPROL-XL) 25 MG 24 hr tablet TAKE 1 TABLET (25 MG TOTAL) BY MOUTH DAILY.   Multiple Vitamin (MULTIVITAMIN ADULT PO) Take 1 capsule by mouth daily.   VITAMIN D PO Take 1 capsule by mouth daily.   No facility-administered encounter medications on file as of 05/13/2023.    Review of Systems:  Review of Systems   Constitutional:  Negative for activity change, appetite change and fever.  HENT:  Negative for sore throat.   Eyes: Negative.   Cardiovascular:  Negative for chest pain and leg swelling.  Gastrointestinal:  Negative for abdominal distention, diarrhea and vomiting.  Genitourinary:  Negative for dysuria, frequency and urgency.  Skin:  Negative for color change.  Neurological:  Negative for dizziness and headaches.  Psychiatric/Behavioral:  Negative for behavioral problems and sleep disturbance. The patient is not nervous/anxious.     Health Maintenance  Topic Date Due   Zoster Vaccines- Shingrix (1 of 2) Never done   Colonoscopy  Never done   COVID-19 Vaccine (6 - 2023-24 season) 04/19/2023   Pneumonia Vaccine 71+ Years old (2 of 2 - PCV) 05/27/2023   Medicare Annual Wellness (AWV)  10/24/2023   DTaP/Tdap/Td (6 - Td or Tdap) 03/15/2033   INFLUENZA VACCINE  Completed   Hepatitis C Screening  Completed   HPV VACCINES  Aged Out    Physical Exam: Vitals:   05/13/23 1052  BP: 121/78  Pulse: 65  Resp: 18  Temp: (!) 97 F (36.1 C)  SpO2: 99%  Weight: 194 lb 3.2 oz (88.1 kg)  Height: 5\' 10"  (1.778 m)   Body mass index is 27.86 kg/m. Physical  Exam Constitutional:      Appearance: Normal appearance.  HENT:     Head: Normocephalic and atraumatic.     Mouth/Throat:     Mouth: Mucous membranes are moist.  Eyes:     Conjunctiva/sclera: Conjunctivae normal.  Cardiovascular:     Rate and Rhythm: Normal rate and regular rhythm.     Pulses: Normal pulses.     Heart sounds: Normal heart sounds.  Pulmonary:     Effort: Pulmonary effort is normal.     Breath sounds: Normal breath sounds.  Abdominal:     General: Bowel sounds are normal.     Palpations: Abdomen is soft.  Musculoskeletal:        General: No swelling. Normal range of motion.     Cervical back: Normal range of motion.     Comments: Bilateral knuckles enlarged  Skin:    General: Skin is warm and dry.   Neurological:     General: No focal deficit present.     Mental Status: He is alert and oriented to person, place, and time.  Psychiatric:        Mood and Affect: Mood normal.        Behavior: Behavior normal.        Thought Content: Thought content normal.        Judgment: Judgment normal.     Labs reviewed: Basic Metabolic Panel: No results for input(s): "NA", "K", "CL", "CO2", "GLUCOSE", "BUN", "CREATININE", "CALCIUM", "MG", "PHOS", "TSH" in the last 8760 hours. Liver Function Tests: No results for input(s): "AST", "ALT", "ALKPHOS", "BILITOT", "PROT", "ALBUMIN" in the last 8760 hours. No results for input(s): "LIPASE", "AMYLASE" in the last 8760 hours. No results for input(s): "AMMONIA" in the last 8760 hours. CBC: No results for input(s): "WBC", "NEUTROABS", "HGB", "HCT", "MCV", "PLT" in the last 8760 hours. Lipid Panel: No results for input(s): "CHOL", "HDL", "LDLCALC", "TRIG", "CHOLHDL", "LDLDIRECT" in the last 8760 hours. No results found for: "HGBA1C"  Procedures since last visit: No results found.  Assessment/Plan  1. Primary hypertension -BP stable -    Continue current medications    Labs/tests ordered:  None  Next appt:  Visit date not found

## 2023-08-12 ENCOUNTER — Encounter: Payer: Medicare HMO | Admitting: Adult Health

## 2023-08-12 NOTE — Progress Notes (Signed)
 This encounter was created in error - please disregard.

## 2023-08-26 ENCOUNTER — Encounter: Payer: Medicare HMO | Admitting: Family

## 2023-09-01 NOTE — Progress Notes (Signed)
   This encounter was created in error - please disregard. No show

## 2023-11-05 ENCOUNTER — Other Ambulatory Visit: Payer: Self-pay | Admitting: Family

## 2023-11-05 DIAGNOSIS — I10 Essential (primary) hypertension: Secondary | ICD-10-CM

## 2023-12-03 ENCOUNTER — Other Ambulatory Visit: Payer: Self-pay | Admitting: Family

## 2023-12-03 DIAGNOSIS — I10 Essential (primary) hypertension: Secondary | ICD-10-CM

## 2023-12-03 NOTE — Telephone Encounter (Signed)
 Left message on voicemail for patient to return call when available. Call was to inform patient that they are due for an appointment.

## 2023-12-05 ENCOUNTER — Ambulatory Visit (INDEPENDENT_AMBULATORY_CARE_PROVIDER_SITE_OTHER): Admitting: Family

## 2023-12-05 ENCOUNTER — Encounter: Payer: Self-pay | Admitting: Family

## 2023-12-05 VITALS — BP 130/68 | HR 64 | Temp 98.2°F | Ht 70.0 in | Wt 200.0 lb

## 2023-12-05 DIAGNOSIS — E559 Vitamin D deficiency, unspecified: Secondary | ICD-10-CM

## 2023-12-05 DIAGNOSIS — I10 Essential (primary) hypertension: Secondary | ICD-10-CM | POA: Diagnosis not present

## 2023-12-05 DIAGNOSIS — E538 Deficiency of other specified B group vitamins: Secondary | ICD-10-CM | POA: Diagnosis not present

## 2023-12-05 DIAGNOSIS — M2540 Effusion, unspecified joint: Secondary | ICD-10-CM

## 2023-12-05 DIAGNOSIS — E782 Mixed hyperlipidemia: Secondary | ICD-10-CM

## 2023-12-09 ENCOUNTER — Ambulatory Visit: Payer: Self-pay | Admitting: Family

## 2023-12-09 ENCOUNTER — Telehealth: Payer: Self-pay

## 2023-12-09 NOTE — Telephone Encounter (Signed)
 We as medical assistants can not tell patients that they do not have anything to worry about until the results are addressed by his provider.

## 2023-12-09 NOTE — Telephone Encounter (Signed)
 Please address labs and sent to the [facility] clinical pool

## 2023-12-09 NOTE — Telephone Encounter (Signed)
 Pt. Wayne Morgan  is calling again, he stated he can wait to have a detailed discussion with his provider. But he just wants a quick call stating it is nothing to worry about.   Please advise.

## 2023-12-09 NOTE — Telephone Encounter (Signed)
 Wayne Morgan due to the consistency of patient calling to inquire about labs can you advise if patient has anything to worry about in regards to the labs that have resulted.  This patient has called x 2 today

## 2023-12-09 NOTE — Telephone Encounter (Signed)
 Copied from CRM [id number]. Topic: Clinical - Lab/Test Results >> Dec 09, 2023  9:39 AM Lenon Radar A wrote: Reason for CRM: Patient would like to discuss lab results. Please contact patient at [phone].

## 2023-12-09 NOTE — Telephone Encounter (Signed)
 See available lab results.

## 2023-12-09 NOTE — Telephone Encounter (Signed)
 Some lab results are back but still awaiting for other results.Once all lab results are resulted will discuss lab results.

## 2023-12-12 ENCOUNTER — Other Ambulatory Visit: Payer: Self-pay | Admitting: Family

## 2023-12-12 DIAGNOSIS — I10 Essential (primary) hypertension: Secondary | ICD-10-CM

## 2023-12-12 LAB — COMPLETE METABOLIC PANEL WITHOUT GFR
AG Ratio: 1.1 (calc) (ref 1.0–2.5)
ALT: 34 U/L (ref 9–46)
AST: 35 U/L (ref 10–35)
Albumin: 3.8 g/dL (ref 3.6–5.1)
Alkaline phosphatase (APISO): 57 U/L (ref 35–144)
BUN/Creatinine Ratio: 34 (calc) — ABNORMAL HIGH (ref 6–22)
BUN: 33 mg/dL — ABNORMAL HIGH (ref 7–25)
CO2: 24 mmol/L (ref 20–32)
Calcium: 9.5 mg/dL (ref 8.6–10.3)
Chloride: 105 mmol/L (ref 98–110)
Creat: 0.98 mg/dL (ref 0.70–1.28)
Globulin: 3.6 g/dL (ref 1.9–3.7)
Glucose, Bld: 91 mg/dL (ref 65–99)
Potassium: 4.3 mmol/L (ref 3.5–5.3)
Sodium: 138 mmol/L (ref 135–146)
Total Bilirubin: 0.8 mg/dL (ref 0.2–1.2)
Total Protein: 7.4 g/dL (ref 6.1–8.1)

## 2023-12-12 LAB — CBC WITH DIFFERENTIAL/PLATELET
Absolute Lymphocytes: 1903 {cells}/uL (ref 850–3900)
Absolute Monocytes: 239 {cells}/uL (ref 200–950)
Basophils Absolute: 31 {cells}/uL (ref 0–200)
Basophils Relative: 0.6 %
Eosinophils Absolute: 182 {cells}/uL (ref 15–500)
Eosinophils Relative: 3.5 %
HCT: 39.1 % (ref 38.5–50.0)
Hemoglobin: 12.8 g/dL — ABNORMAL LOW (ref 13.2–17.1)
MCH: 33.2 pg — ABNORMAL HIGH (ref 27.0–33.0)
MCHC: 32.7 g/dL (ref 32.0–36.0)
MCV: 101.3 fL — ABNORMAL HIGH (ref 80.0–100.0)
MPV: 10 fL (ref 7.5–12.5)
Monocytes Relative: 4.6 %
Neutro Abs: 2844 {cells}/uL (ref 1500–7800)
Neutrophils Relative %: 54.7 %
Platelets: 217 10*3/uL (ref 140–400)
RBC: 3.86 10*6/uL — ABNORMAL LOW (ref 4.20–5.80)
RDW: 14.8 % (ref 11.0–15.0)
Total Lymphocyte: 36.6 %
WBC: 5.2 10*3/uL (ref 3.8–10.8)

## 2023-12-12 LAB — ANA, IFA COMPREHENSIVE PANEL
Anti Nuclear Antibody (ANA): POSITIVE — AB
SM/RNP: <1 AI
SSA (Ro) (ENA) Antibody, IgG: <1 AI
SSA (Ro) (ENA) Antibody, IgG: <1 AI
SSB (La) (ENA) Antibody, IgG: <1 AI
Scleroderma (Scl-70) (ENA) Antibody, IgG: <1 AI
ds DNA Ab: <1 [IU]/mL

## 2023-12-12 LAB — TSH: TSH: 0.97 m[IU]/L (ref 0.40–4.50)

## 2023-12-12 LAB — LIPID PANEL
Cholesterol: 179 mg/dL (ref <200)
HDL: 40 mg/dL (ref >=40)
LDL Cholesterol (Calc): 119 mg/dL — ABNORMAL HIGH
Non-HDL Cholesterol (Calc): 139 mg/dL — ABNORMAL HIGH (ref <130)
Total CHOL/HDL Ratio: 4.5 (calc) (ref <5.0)
Triglycerides: 101 mg/dL (ref <150)

## 2023-12-12 LAB — RHEUMATOID FACTOR: Rheumatoid fact SerPl-aCnc: <10 [IU]/mL (ref <14)

## 2023-12-12 LAB — ANTI-NUCLEAR AB-TITER (ANA TITER): ANA Titer 1: 1:40 {titer} — ABNORMAL HIGH

## 2023-12-12 LAB — VITAMIN D 1,25 DIHYDROXY
Vitamin D 1, 25 (OH)2 Total: 41 pg/mL (ref 18–72)
Vitamin D2 1, 25 (OH)2: <8 pg/mL
Vitamin D3 1, 25 (OH)2: 41 pg/mL

## 2023-12-12 LAB — VITAMIN B12: Vitamin B-12: 985 pg/mL (ref 200–1100)

## 2023-12-13 NOTE — Progress Notes (Signed)
 Provider: Christean Courts FNP-C  Jayshawn Colston, Elijio Guadeloupe, NP  Patient Care Team: Montell Leopard, Elijio Guadeloupe, NP as PCP - General (Family Medicine)  Extended Emergency Contact Information Primary Emergency Contact: Marcell,Ronnie Home Phone: [phone] Relation: Other  Code Status: Full Code  Goals of care: Advanced Directive information    12/05/2023    9:55 AM  Advanced Directives  Does Patient Have a Medical Advance Directive? No  Would patient like information on creating a medical advance directive? No - Patient declined     Chief Complaint  Patient presents with   routine visit    Discussed the use of AI scribe software for clinical note transcription with the patient, who gave verbal consent to proceed.  History of Present Illness   Wayne Morgan is a 73 year old male with osteoarthritis who presents with knee instability and recent falls.  He experiences knee instability with buckling, leading to falls, which has been distressing on multiple levels. He fears falling with each step and uses knee braces for stability. He has difficulty getting up after falls. Diagnosed with osteoarthritis in his knees, a small meniscus tear, and a Baker's cyst on the right knee, with a smaller cyst on the left, he experiences grinding in the knees. He takes amoxicam for pain and wraps his knees for support. He sees an orthopedic specialist monthly.  He has gained weight from 194 to 200 pounds, attributing it to dietary habits, specifically peanut butter consumption. 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  He takes B12, a multivitamin, and vitamin D. He no longer uses melatonin and reports getting 40 minutes more sleep now. For blood pressure, he takes metoprolol , lisinopril, and amlodipine , but not hydralazine .  He experiences depression, which he attributes to living with a dog he dislikes and another person in his  house. He does not consume alcohol.  He has a family history of cancer and is concerned about his health following the recent death of his oldest brother at 38. He desires a comprehensive health check to ensure nothing is 'hiding'.  He reports swelling in his right leg, particularly the ankle, which is painful when walking. He has not worn compression stockings before.  He has noticed changes in his hands, with fingers deviating to the side, and suspects a family history of rheumatoid arthritis. He experiences difficulty handling objects but reports no pain, only swelling in the joints.    Past Medical History:  Diagnosis Date   Gout    Hypertension    Past Surgical History:  Procedure Laterality Date   OTHER SURGICAL HISTORY  1960   Hernia     Allergies  Allergen Reactions   Shellfish Allergy     Outpatient Encounter Medications as of 12/05/2023  Medication Sig   acetaminophen  (TYLENOL ) 500 MG tablet Take 1 tablet (500 mg total) by mouth every 8 (eight) hours as needed.   Cyanocobalamin (VITAMIN B12 PO) Take 1 capsule by mouth daily at 2 am.   lisinopril (ZESTRIL) 40 MG tablet Take 40 mg by mouth every morning.   meloxicam (MOBIC) 15 MG tablet Take 15 mg by mouth daily as needed for pain.   metoprolol  succinate (TOPROL -XL) 25 MG 24 hr tablet TAKE 1 TABLET (25 MG TOTAL) BY MOUTH DAILY.   Multiple Vitamin (MULTIVITAMIN ADULT PO) Take 1 capsule by mouth daily.   VITAMIN D PO Take 1 capsule by mouth daily.   [DISCONTINUED]  amLODipine  (NORVASC ) 5 MG tablet TAKE 1 TABLET (5 MG TOTAL) BY MOUTH DAILY. NEEDS AN APPOINTMENT BEFORE ANYMORE FUTURE REFILLS.   [DISCONTINUED] hydrALAZINE  (APRESOLINE ) 25 MG tablet TAKE 1 TABLET BY MOUTH THREE TIMES A DAY (Patient not taking: Reported on 12/05/2023)   [DISCONTINUED] melatonin 3 MG TABS tablet Take 3 mg by mouth at bedtime. May take up to 6mg . (Patient not taking: Reported on 12/05/2023)   No facility-administered encounter medications on file as  of 12/05/2023.    Review of Systems  Constitutional:  Negative for appetite change, chills, fatigue, fever and unexpected weight change.  HENT:  Negative for congestion, dental problem, ear discharge, ear pain, facial swelling, hearing loss, nosebleeds, postnasal drip, rhinorrhea, sinus pressure, sinus pain, sneezing, sore throat, tinnitus and trouble swallowing.   Eyes:  Negative for pain, discharge, redness, itching and visual disturbance.  Respiratory:  Negative for cough, chest tightness, shortness of breath and wheezing.   Cardiovascular:  Negative for chest pain, palpitations and leg swelling.  Gastrointestinal:  Negative for abdominal distention, abdominal pain, blood in stool, constipation, diarrhea, nausea and vomiting.  Endocrine: Negative for cold intolerance.  Musculoskeletal:  Positive for arthralgias. Negative for back pain, gait problem, joint swelling, myalgias, neck pain and neck stiffness.  Skin:  Negative for color change, pallor, rash and wound.  Neurological:  Negative for dizziness, syncope, speech difficulty, weakness, light-headedness, numbness and headaches.  Hematological:  Does not bruise/bleed easily.  Psychiatric/Behavioral:  Negative for agitation, behavioral problems, confusion, hallucinations, self-injury, sleep disturbance and suicidal ideas. The patient is not nervous/anxious.     Immunization History  Administered Date(s) Administered   Fluad Quad(high Dose 65+) 05/26/2022   Influenza, High Dose Seasonal PF 02/22/2023   Influenza,inj,Quad PF,6+ Mos 04/27/2016, 04/26/2017, 06/19/2018   Influenza-Unspecified 05/13/2015, 04/26/2019, 04/14/2020, 03/25/2021   PFIZER(Purple Top)SARS-COV-2 Vaccination 08/17/2019, 09/07/2019, 04/14/2020   PPD Test 09/08/2003   Pfizer(Comirnaty)Fall Seasonal Vaccine 12 years and older 05/26/2022   Pneumococcal Polysaccharide-23 04/27/2016, 05/26/2022   Td 05/06/2003   Td (Adult), 2 Lf Tetanus Toxid, Preservative Free 05/06/2003    Tdap 05/05/2013, 10/06/2015, 03/16/2023   Unspecified SARS-COV-2 Vaccination 02/22/2023   Pertinent  Health Maintenance Due  Topic Date Due   Colonoscopy  Never done   INFLUENZA VACCINE  01/24/2024      10/24/2022   10:09 AM 03/25/2023    1:11 PM 04/11/2023   10:42 AM 05/13/2023   10:52 AM 12/05/2023    9:55 AM  Fall Risk  Falls in the past year? 0 1 1 0 1  Was there an injury with Fall? 0 1 1 0 1  Fall Risk Category Calculator 0 2 2 0 3  Patient at Risk for Falls Due to No Fall Risks   No Fall Risks No Fall Risks  Fall risk Follow up Falls evaluation completed   Falls evaluation completed Falls evaluation completed   Functional Status Survey:    Vitals:   12/05/23 0957 12/05/23 1000  BP: (!) 140/84 130/68  Pulse: 64   Temp: 98.2 F (36.8 C)   SpO2: 98%   Weight: 200 lb (90.7 kg)   Height: 5' 10 (1.778 m)    Body mass index is 28.7 kg/m. Physical Exam  VITALS: T- 98.2, P- 64, BP- 130/, SaO2- 98% MEASUREMENTS: Weight- 200. GENERAL: Alert, cooperative, well developed, no acute distress HEENT: Normocephalic, normal oropharynx, moist mucous membranes, nose normal, pharynx normal CHEST: Clear to auscultation bilaterally, no wheezes, rhonchi, or crackles CARDIOVASCULAR: Normal heart rate and rhythm, S1 and S2  normal without murmurs ABDOMEN: Soft, non-tender, non-distended, without organomegaly, normal bowel sounds EXTREMITIES: No cyanosis, right leg swollen, left leg not swollen, normal range of motion in both legs MUSCULOSKELETAL: Fingers deviated laterally, knee crepitus, normal range of motion in both knees NEUROLOGICAL: Cranial nerves grossly intact, moves all extremities without gross motor or sensory deficit  SKIN: No rash,no lesion or erythema   PSYCHIATRY/BEHAVIORAL: Mood stable    Labs reviewed: Recent Labs    12/05/23 1040  NA 138  K 4.3  CL 105  CO2 24  GLUCOSE 91  BUN 33*  CREATININE 0.98  CALCIUM 9.5   Recent Labs    12/05/23 1040  AST 35   ALT 34  BILITOT 0.8  PROT 7.4   Recent Labs    12/05/23 1040  WBC 5.2  NEUTROABS 2,844  HGB 12.8*  HCT 39.1  MCV 101.3*  PLT 217   Lab Results  Component Value Date   TSH 0.97 12/05/2023   No results found for: HGBA1C Lab Results  Component Value Date   CHOL 179 12/05/2023   HDL 40 12/05/2023   LDLCALC 119 (H) 12/05/2023   TRIG 101 12/05/2023   CHOLHDL 4.5 12/05/2023    Significant Diagnostic Results in last 30 days:  No results found.  Assessment/Plan Knee Instability Bilateral knee buckling leads to falls, causing psychological and physical distress. Small meniscus tear and Baker's cyst in the right knee, smaller cyst in the left knee. Uses knee braces and advised to perform knee exercises. Considering self-directed exercises. Orthopedic follow-up is monthly. - Continue using knee braces - Perform knee exercises as advised - Consider referral to physical therapy if symptoms persist  Osteoarthritis Affects knees and hands, causing joint swelling and difficulty with manual tasks. No pain reported but significant functional impairment. Family history suggests possible rheumatoid arthritis. - Order rheumatoid factor and ANA to rule out rheumatoid arthritis - Consider referral to rheumatology if tests are positive - Encourage use of stress balls for hand exercises  Hypertension Blood pressure initially 140/84 mmHg, decreased to 130/?. On metoprolol , lisinopril, and amlodipine . Not taking hydralazine  as previously prescribed by the [place]. Weight gain noted, impacting blood pressure control. Discussed importance of weight management to prevent complications such as increased blood pressure, diabetes, and cholesterol issues. - Monitor blood pressure regularly - Encourage weight management through diet and exercise - Discontinue hydralazine   Depression Daily feelings of depression, exacerbated by living conditions and recent family loss. No interest in specific treatment  options during the visit. - Discuss potential mental health support options - Consider referral to mental health services if symptoms persist  General Health Maintenance Desires comprehensive lab work due to family history concerns. Not interested in shingles vaccine but received pneumonia and COVID vaccines. Due for colonoscopy, recent results from [place] were negative. Prefers lab work here for peace of mind. - Order comprehensive lab work including vitamin D, B12, and glucose - Request records from [facility] and COVID vaccines - Request records from [place] for recent colonoscopy  Follow-up Regular follow-ups advised to monitor health conditions and lab results. - Schedule follow-up appointment in six months - Follow up on lab results and adjust treatment as necessary   Family/ staff Communication: Reviewed plan of care with patient verbalized understanding  Labs/tests ordered:  - CBC with Differential/Platelet - CMP with eGFR([facility]) - TSH - Lipid panel - Vitamin B 12 - RF -ANA  Next Appointment: Return in about 6 months (around 06/05/2024) for medical mangement of chronic issues.,  Fasting labs in 6 months prior to visit.   Total time:30 minutes. Greater than 50% of total time spent doing patient education regarding HTN,OA,Depression,Knee instability ,Joint swelling on hands and feet health maintenance including symptom/medication management.   Estil Heman, NP

## 2024-03-06 ENCOUNTER — Telehealth: Payer: Self-pay

## 2024-03-06 MED ORDER — SPIKEVAX 50 MCG/0.5ML IM SUSP: 0.5000 mL | INTRAMUSCULAR | 1 refills | Status: AC

## 2024-03-06 NOTE — Telephone Encounter (Signed)
 Copied from CRM [id number]. Topic: Clinical - Request for Lab/Test Order >> Mar 06, 2024 11:08 AM Merlynn A wrote: Reason for CRM: Patient called in to request prescription for COVID shot. Please send prescription to pharmacy.

## 2024-03-06 NOTE — Telephone Encounter (Signed)
 Prescription sent!! Left detailed message informing patient

## 2024-06-03 ENCOUNTER — Other Ambulatory Visit: Payer: Self-pay

## 2024-06-03 DIAGNOSIS — E782 Mixed hyperlipidemia: Secondary | ICD-10-CM

## 2024-06-03 DIAGNOSIS — I10 Essential (primary) hypertension: Secondary | ICD-10-CM

## 2024-06-04 ENCOUNTER — Ambulatory Visit: Payer: Self-pay | Admitting: Family

## 2024-06-04 LAB — COMPLETE METABOLIC PANEL WITHOUT GFR
AG Ratio: 1.2 (calc) (ref 1.0–2.5)
ALT: 46 U/L (ref 9–46)
AST: 45 U/L — ABNORMAL HIGH (ref 10–35)
Albumin: 4.1 g/dL (ref 3.6–5.1)
Alkaline phosphatase (APISO): 57 U/L (ref 35–144)
BUN/Creatinine Ratio: 41 (calc) — ABNORMAL HIGH (ref 6–22)
BUN: 43 mg/dL — ABNORMAL HIGH (ref 7–25)
CO2: 23 mmol/L (ref 20–32)
Calcium: 9.3 mg/dL (ref 8.6–10.3)
Chloride: 109 mmol/L (ref 98–110)
Creat: 1.04 mg/dL (ref 0.70–1.28)
Globulin: 3.3 g/dL (ref 1.9–3.7)
Glucose, Bld: 85 mg/dL (ref 65–99)
Potassium: 4.3 mmol/L (ref 3.5–5.3)
Sodium: 139 mmol/L (ref 135–146)
Total Bilirubin: 0.7 mg/dL (ref 0.2–1.2)
Total Protein: 7.4 g/dL (ref 6.1–8.1)

## 2024-06-04 LAB — CBC WITH DIFFERENTIAL/PLATELET
Absolute Lymphocytes: 2272 {cells}/uL (ref 850–3900)
Absolute Monocytes: 380 {cells}/uL (ref 200–950)
Basophils Absolute: 22 {cells}/uL (ref 0–200)
Basophils Relative: 0.4 %
Eosinophils Absolute: 193 {cells}/uL (ref 15–500)
Eosinophils Relative: 3.5 %
HCT: 41.1 % (ref 39.4–51.1)
Hemoglobin: 13.4 g/dL (ref 13.2–17.1)
MCH: 32.4 pg (ref 27.0–33.0)
MCHC: 32.6 g/dL (ref 31.6–35.4)
MCV: 99.3 fL (ref 81.4–101.7)
MPV: 10.3 fL (ref 7.5–12.5)
Monocytes Relative: 6.9 %
Neutro Abs: 2635 {cells}/uL (ref 1500–7800)
Neutrophils Relative %: 47.9 %
Platelets: 265 Thousand/uL (ref 140–400)
RBC: 4.14 Million/uL — ABNORMAL LOW (ref 4.20–5.80)
RDW: 16.2 % — ABNORMAL HIGH (ref 11.0–15.0)
Total Lymphocyte: 41.3 %
WBC: 5.5 Thousand/uL (ref 3.8–10.8)

## 2024-06-04 LAB — LIPID PANEL
Cholesterol: 177 mg/dL (ref <200)
HDL: 40 mg/dL (ref >=40)
LDL Cholesterol (Calc): 114 mg/dL — ABNORMAL HIGH
Non-HDL Cholesterol (Calc): 137 mg/dL — ABNORMAL HIGH (ref <130)
Total CHOL/HDL Ratio: 4.4 (calc) (ref <5.0)
Triglycerides: 124 mg/dL (ref <150)

## 2024-06-04 LAB — TSH: TSH: 0.96 m[IU]/L (ref 0.40–4.50)

## 2024-06-05 ENCOUNTER — Emergency Department (HOSPITAL_COMMUNITY): Payer: Worker's Compensation

## 2024-06-05 ENCOUNTER — Ambulatory Visit: Payer: Self-pay | Admitting: Family

## 2024-06-05 ENCOUNTER — Encounter: Payer: Self-pay | Admitting: Family

## 2024-06-05 ENCOUNTER — Other Ambulatory Visit: Payer: Self-pay

## 2024-06-05 ENCOUNTER — Other Ambulatory Visit: Payer: Self-pay | Admitting: Family

## 2024-06-05 ENCOUNTER — Emergency Department (HOSPITAL_COMMUNITY)
Admission: EM | Admit: 2024-06-05 | Discharge: 2024-06-05 | Disposition: A | Discharge status: Home / Self Care | Payer: Worker's Compensation | Attending: Emergency Medicine | Admitting: Emergency Medicine

## 2024-06-05 VITALS — BP 138/80 | HR 76 | Temp 98.4°F | Ht 70.0 in | Wt 197.4 lb

## 2024-06-05 DIAGNOSIS — M25561 Pain in right knee: Secondary | ICD-10-CM

## 2024-06-05 DIAGNOSIS — I10 Essential (primary) hypertension: Secondary | ICD-10-CM | POA: Insufficient documentation

## 2024-06-05 DIAGNOSIS — W19XXXA Unspecified fall, initial encounter: Secondary | ICD-10-CM

## 2024-06-05 DIAGNOSIS — R600 Localized edema: Secondary | ICD-10-CM

## 2024-06-05 DIAGNOSIS — R748 Abnormal levels of other serum enzymes: Secondary | ICD-10-CM

## 2024-06-05 DIAGNOSIS — E559 Vitamin D deficiency, unspecified: Secondary | ICD-10-CM

## 2024-06-05 DIAGNOSIS — E538 Deficiency of other specified B group vitamins: Secondary | ICD-10-CM

## 2024-06-05 DIAGNOSIS — M25562 Pain in left knee: Secondary | ICD-10-CM

## 2024-06-05 DIAGNOSIS — M1712 Unilateral primary osteoarthritis, left knee: Secondary | ICD-10-CM

## 2024-06-05 DIAGNOSIS — Y990 Civilian activity done for income or pay: Secondary | ICD-10-CM | POA: Diagnosis not present

## 2024-06-05 DIAGNOSIS — Z79899 Other long term (current) drug therapy: Secondary | ICD-10-CM | POA: Insufficient documentation

## 2024-06-05 DIAGNOSIS — E782 Mixed hyperlipidemia: Secondary | ICD-10-CM

## 2024-06-05 DIAGNOSIS — G8929 Other chronic pain: Secondary | ICD-10-CM

## 2024-06-05 LAB — CBC WITH DIFFERENTIAL/PLATELET
Abs Immature Granulocytes: 0.04 K/uL (ref 0.00–0.07)
Basophils Absolute: 0 K/uL (ref 0.0–0.1)
Basophils Relative: 1 %
Eosinophils Absolute: 0.1 K/uL (ref 0.0–0.5)
Eosinophils Relative: 2 %
HCT: 39.8 % (ref 39.0–52.0)
Hemoglobin: 13.4 g/dL (ref 13.0–17.0)
Immature Granulocytes: 1 %
Lymphocytes Relative: 27 %
Lymphs Abs: 1.7 K/uL (ref 0.7–4.0)
MCH: 33 pg (ref 26.0–34.0)
MCHC: 33.7 g/dL (ref 30.0–36.0)
MCV: 98 fL (ref 80.0–100.0)
Monocytes Absolute: 0.3 K/uL (ref 0.1–1.0)
Monocytes Relative: 5 %
Neutro Abs: 4 K/uL (ref 1.7–7.7)
Neutrophils Relative %: 64 %
Platelets: 257 K/uL (ref 150–400)
RBC: 4.06 MIL/uL — ABNORMAL LOW (ref 4.22–5.81)
RDW: 15.5 % (ref 11.5–15.5)
WBC: 6.2 K/uL (ref 4.0–10.5)
nRBC: 0 % (ref 0.0–0.2)

## 2024-06-05 LAB — COMPREHENSIVE METABOLIC PANEL WITH GFR
ALT: 55 U/L — ABNORMAL HIGH (ref 0–44)
AST: 52 U/L — ABNORMAL HIGH (ref 15–41)
Albumin: 3.6 g/dL (ref 3.5–5.0)
Alkaline Phosphatase: 57 U/L (ref 38–126)
Anion gap: 8 (ref 5–15)
BUN: 35 mg/dL — ABNORMAL HIGH (ref 8–23)
CO2: 26 mmol/L (ref 22–32)
Calcium: 8.9 mg/dL (ref 8.9–10.3)
Chloride: 107 mmol/L (ref 98–111)
Creatinine, Ser: 0.93 mg/dL (ref 0.61–1.24)
GFR, Estimated: >60 mL/min (ref >60)
Glucose, Bld: 112 mg/dL — ABNORMAL HIGH (ref 70–99)
Potassium: 4 mmol/L (ref 3.5–5.1)
Sodium: 141 mmol/L (ref 135–145)
Total Bilirubin: 0.9 mg/dL (ref 0.0–1.2)
Total Protein: 7.6 g/dL (ref 6.5–8.1)

## 2024-06-05 LAB — MAGNESIUM: Magnesium: 2.2 mg/dL (ref 1.7–2.4)

## 2024-06-05 MED ORDER — NAPROXEN 500 MG PO TABS: 500.0000 mg | ORAL_TABLET | Freq: Two times a day (BID) | ORAL | 0 refills | Status: AC

## 2024-06-05 MED ORDER — HYDROCHLOROTHIAZIDE 12.5 MG PO TABS: 12.5000 mg | ORAL_TABLET | Freq: Every day | ORAL | 3 refills | Status: AC

## 2024-06-05 NOTE — Telephone Encounter (Unsigned)
 Copied from CRM #[id number]. Topic: Clinical - Medication Refill >> Jun 05, 2024 12:04 PM Debby BROCKS wrote: Medication:  metoprolol  succinate (TOPROL -XL) 25 MG 24 hr tablet   Has the patient contacted their pharmacy? Yes (Agent: If no, request that the patient contact the pharmacy for the refill. If patient does not wish to contact the pharmacy document the reason why and proceed with request.) (Agent: If yes, when and what did the pharmacy advise?) Pharmacy states they sent a request to [facility] but no response  This is the patient's preferred pharmacy:  [id number] - [place] - [address] [place] [address] Phone: [phone] Fax: [phone]  Is this the correct pharmacy for this prescription? No If no, delete pharmacy and type the correct one.   Has the prescription been filled recently? No  Is the patient out of the medication? Yes  Has the patient been seen for an appointment in the last year OR does the patient have an upcoming appointment? Yes  Can we respond through MyChart? Yes  Agent: Please be advised that Rx refills may take up to 3 business days. We ask that you follow-up with your pharmacy.

## 2024-06-05 NOTE — ED Provider Notes (Cosign Needed)
 [place] EMERGENCY DEPARTMENT AT [facility] Provider Note   CSN: [id number] Arrival date & time: 06/05/24  1548     Patient presents with: Wayne Morgan is a 73 y.o. male.  History of hypertension, gout presenting from work status post fall.  History per patient, presenting with EMS.  Reports that he was going up the stairs at work, felt his knees buckle.  Fell backwards, hitting his head.  Denies any pain at this time, denies LOC, chest pain, shortness of breath, dizziness, lightheadedness.  Denies any chest pain, palpitations, shortness of breath, or dizziness associated with the episode.  Patient Dors is he feels primarily weak at this time.  Denies taking any blood thinners.  Denies nausea or vomiting.  States that he primarily felt his left knee buckle on him, and this is why he fell backwards.  Primarily hit his head, and back.  Denies any pain at this time.  He endorses that he was able to get up and walk without difficulty following the fall.  Endorses some lightheadedness at this time, denies nausea or vomiting.    Fall       Prior to Admission medications  Medication Sig Start Date End Date Taking? Authorizing Provider  naproxen (NAPROSYN) 500 MG tablet Take 1 tablet (500 mg total) by mouth 2 (two) times daily. 06/05/24  Yes Arlee Katz, MD  acetaminophen  (TYLENOL ) 500 MG tablet Take 1 tablet (500 mg total) by mouth every 8 (eight) hours as needed. 02/16/21   Ngetich, Dinah C, NP  amLODipine  (NORVASC ) 5 MG tablet TAKE 1 TABLET (5 MG TOTAL) BY MOUTH DAILY. NEEDS AN APPOINTMENT BEFORE ANYMORE FUTURE REFILLS. 12/12/23   Ngetich, Dinah C, NP  COVID-19 mRNA vaccine, Moderna, >/= 59yrs, (SPIKEVAX ) injection Inject 0.5 mLs into the muscle as directed. 03/06/24   Ngetich, Dinah C, NP  Cyanocobalamin (VITAMIN B12 PO) Take 1 capsule by mouth daily at 2 am.    [provider]  hydrochlorothiazide (HYDRODIURIL) 12.5 MG tablet Take 1 tablet (12.5 mg  total) by mouth daily. 06/05/24   Ngetich, Dinah C, NP  lisinopril (ZESTRIL) 40 MG tablet Take 40 mg by mouth every morning.    [provider]  meloxicam (MOBIC) 15 MG tablet Take 15 mg by mouth daily as needed for pain.    [provider]  metoprolol  succinate (TOPROL -XL) 25 MG 24 hr tablet TAKE 1 TABLET (25 MG TOTAL) BY MOUTH DAILY. 12/28/21   Ngetich, Dinah C, NP  Multiple Vitamin (MULTIVITAMIN ADULT PO) Take 1 capsule by mouth daily.    [provider]  VITAMIN D  PO Take 1 capsule by mouth daily.    [provider]    Allergies: Shellfish allergy    Review of Systems  Updated Vital Signs BP (!) 147/85 (BP Location: Left Arm)   Pulse 73   Temp 98.1 F (36.7 C) (Oral)   Resp 18   SpO2 100%   Physical Exam Vitals and nursing note reviewed.  Constitutional:      General: He is not in acute distress.    Appearance: He is well-developed.  HENT:     Head: Normocephalic and atraumatic.  Eyes:     Conjunctiva/sclera: Conjunctivae normal.  Cardiovascular:     Rate and Rhythm: Normal rate and regular rhythm.     Pulses: Normal pulses.     Heart sounds: No murmur heard.    No gallop.  Pulmonary:     Effort: Pulmonary effort is  normal. No respiratory distress.     Breath sounds: Normal breath sounds.  Abdominal:     General: Abdomen is flat. There is no distension.     Palpations: Abdomen is soft.     Tenderness: There is no abdominal tenderness.  Musculoskeletal:        General: No swelling, tenderness, deformity or signs of injury. Normal range of motion.     Cervical back: Normal range of motion and neck supple. No rigidity or tenderness.     Left lower leg: Edema present.     Comments: Chronic contractures of bilateral hands, reduced ROM of digits secondary to chronic contractures.  Patient states this is his current baseline. Bilateral knees in braces.  Full ROM bilateral ankles, knees, hips, wrists, elbows, and shoulders.  2+ radial, DP/PT  pulses.  Skin:    General: Skin is warm and dry.     Capillary Refill: Capillary refill takes less than 2 seconds.  Neurological:     General: No focal deficit present.     Mental Status: He is alert and oriented to person, place, and time.     Cranial Nerves: No cranial nerve deficit.     Sensory: No sensory deficit.     Motor: No weakness.     Gait: Gait normal.  Psychiatric:        Mood and Affect: Mood normal.     (all labs ordered are listed, but only abnormal results are displayed) Labs Reviewed  CBC WITH DIFFERENTIAL/PLATELET - Abnormal; Notable for the following components:      Result Value   RBC 4.06 (*)    All other components within normal limits  COMPREHENSIVE METABOLIC PANEL WITH GFR - Abnormal; Notable for the following components:   Glucose, Bld 112 (*)    BUN 35 (*)    AST 52 (*)    ALT 55 (*)    All other components within normal limits  MAGNESIUM    EKG: EKG Interpretation Date/Time:  Friday June 05 2024 17:54:49 EST Ventricular Rate:  76 PR Interval:  222 QRS Duration:  150 QT Interval:  408 QTC Calculation: 459 R Axis:   -70  Text Interpretation: Sinus rhythm with 1st degree A-V block Left axis deviation Right bundle branch block Left ventricular hypertrophy with repolarization abnormality ( R in aVL , Romhilt-Estes ) Abnormal ECG No previous ECGs available Confirmed by Darra Chew [phone]) on 06/05/2024 5:56:59 PM  Radiology: DG Chest 1 View Result Date: 06/05/2024 EXAM: 1 VIEW XRAY OF THE CHEST 06/05/2024 05:05:00 PM COMPARISON: None available. CLINICAL HISTORY: Fall. FINDINGS: LUNGS AND PLEURA: No focal pulmonary opacity. No pleural effusion. No pneumothorax. HEART AND MEDIASTINUM: No acute abnormality of the cardiac and mediastinal silhouettes. BONES AND SOFT TISSUES: No acute osseous abnormality. IMPRESSION: 1. No acute cardiopulmonary process. Electronically signed by: Pinkie Pebbles MD 06/05/2024 06:08 PM EST RP Workstation: [id number]    CT Cervical Spine Wo Contrast Result Date: 06/05/2024 EXAM: CT CERVICAL SPINE WITHOUT CONTRAST 06/05/2024 05:26:14 PM TECHNIQUE: CT of the cervical spine was performed without the administration of intravenous contrast. Multiplanar reformatted images are provided for review. Automated exposure control, iterative reconstruction, and/or weight based adjustment of the mA/kV was utilized to reduce the radiation dose to as low as reasonably achievable. COMPARISON: None available. CLINICAL HISTORY: Fall, not on blood thinners FINDINGS: CERVICAL SPINE: BONES AND ALIGNMENT: No acute fracture or traumatic malalignment. DEGENERATIVE CHANGES: No significant degenerative changes. SOFT TISSUES: No prevertebral soft tissue swelling. IMPRESSION: 1. No acute abnormality  of the cervical spine. Electronically signed by: Pinkie Pebbles MD 06/05/2024 06:08 PM EST RP Workstation: [id number]   CT Head Wo Contrast Result Date: 06/05/2024 EXAM: CT HEAD WITHOUT 06/05/2024 05:26:14 PM TECHNIQUE: CT of the head was performed without the administration of intravenous contrast. Automated exposure control, iterative reconstruction, and/or weight based adjustment of the mA/kV was utilized to reduce the radiation dose to as low as reasonably achievable. COMPARISON: 03/16/2023 CLINICAL HISTORY: fall, not on thinners, lightheadedness FINDINGS: BRAIN AND VENTRICLES: No acute intracranial hemorrhage. No mass effect or midline shift. No extra-axial fluid collection. No evidence of acute infarct. No hydrocephalus. Global cortical atrophy. Subcortical and periventricular small vessel ischemic changes. Intracranial atherosclerosis. ORBITS: No acute abnormality. SINUSES AND MASTOIDS: No acute abnormality. SOFT TISSUES AND SKULL: No acute skull fracture. Right parietal scalp hematoma. IMPRESSION: 1. No acute intracranial abnormality. 2. Right parietal scalp hematoma. Electronically signed by: Pinkie Pebbles MD 06/05/2024 06:07 PM EST RP  Workstation: [id number]   DG Knee 2 Views Left Result Date: 06/05/2024 EXAM: 2 VIEW(S) XRAY OF THE LEFT KNEE 06/05/2024 05:05:00 PM COMPARISON: None available. CLINICAL HISTORY: fall on back and head FINDINGS: BONES AND JOINTS: No acute fracture. No malalignment. No significant joint effusion. Mild degenerative changes of the knee. External brace in place. SOFT TISSUES: Vascular calcifications. IMPRESSION: 1. No acute fracture or dislocation. Electronically signed by: Pinkie Pebbles MD 06/05/2024 06:06 PM EST RP Workstation: [id number]     Procedures   Medications Ordered in the ED - No data to display                                  Medical Decision Making Amount and/or Complexity of Data Reviewed Labs: ordered. Radiology: ordered.  Risk Prescription drug management.   Based on patient presentation, history, evaluation, high suspicion for left osteoarthritis causing buckling, and associated fall.  Imaging, workup overall reassuring today, I have low suspicion for intracranial bleed versus intracranial mass versus C-spine injury versus rib fracture versus intercostal muscle strain versus pneumothorax versus pulmonary contusion versus femur fracture versus tibia fracture versus fibular fracture versus knee dislocation versus patellar dislocation versus AKI versus electrolyte derangement versus hypoglycemia.  Patient overall asymptomatic with associated fall, symptomatically mechanical in nature with a locking of the left knee, no associated chest pain, lightheadedness, dizziness, shortness of breath, or palpitations associated, I have a low suspicion for cardiogenic syncope at this time.  Overall with reassuring workup, patient able to walk around the ED without difficulty, no episodes of lightheadedness, tolerating p.o. intake well, patient is overall stable for discharge at this time.  Recommend follow-up with PCP, as well as provided referral to orthopedic surgery to discuss management  for left knee arthritis.  Strict return precautions discussed.     Final diagnoses:  Fall, initial encounter  Osteoarthritis of left knee, unspecified osteoarthritis type    ED Discharge Orders          Ordered    naproxen (NAPROSYN) 500 MG tablet  2 times daily        06/05/24 1816    Ambulatory referral to Orthopedic Surgery        06/05/24 1817               Arlee Katz, MD 06/05/24 2349

## 2024-06-05 NOTE — ED Notes (Signed)
 Patient transported to X-ray

## 2024-06-05 NOTE — Discharge Instructions (Addendum)
 Kidney function improved.   I would recommend taking tylenol  and motrin  for pain.   Recommend follow up with orthopedic surgery for arthritis of your knee.

## 2024-06-05 NOTE — ED Triage Notes (Signed)
 Pt. BIB [facility] from work with c/o a fall; Per [facility], the pt. Was going up the stairs at his work when he felt his knees buckle, the pt. Clemens backwards hitting his head. Pt. Denies any pain, denies any LOC, CP, ShOB, or dizziness. Pt. Just feels weak at this time. Hx of HTN. Pt. Not on any blood thinners.

## 2024-06-05 NOTE — Patient Instructions (Signed)
 -  Please get bilateral knee X-ray at [facility] imaging at [facility] then will call you with results.

## 2024-06-08 ENCOUNTER — Telehealth: Payer: Self-pay

## 2024-06-08 MED ORDER — METOPROLOL SUCCINATE ER 25 MG PO TB24: 25.0000 mg | ORAL_TABLET | Freq: Every day | ORAL | 1 refills | Status: AC

## 2024-06-08 NOTE — Telephone Encounter (Signed)
 Please schedule in person office visit appointment to evaluate fall.

## 2024-06-08 NOTE — Telephone Encounter (Signed)
 Copied from CRM #[id number]. Topic: Clinical - Lab/Test Results >> Jun 08, 2024  3:21 PM Farrel B wrote: Reason for CRM: [id number] Mr. Steward, has called requesting a callback to go over everything with labs, he stated after his visit with NP Dinah he experienced another fall pt states explanation on abnormal labs and needing to know what needs to be done.

## 2024-06-15 NOTE — Progress Notes (Signed)
 "  Provider: Folashade Gamboa FNP-C   Chyla Schlender, Roxan BROCKS, NP  Patient Care Team: Annita Ratliff, Roxan BROCKS, NP as PCP - General (Family Medicine)  Extended Emergency Contact Information Primary Emergency Contact: Car,Ronnie Home Phone: [phone] Relation: Other  Code Status: Full code Goals of care: Advanced Directive information    06/05/2024    3:52 PM  Advanced Directives  Does Patient Have a Medical Advance Directive? No     Chief Complaint  Patient presents with   Medical Management of Chronic Issues    HPI:  Pt is a 73 y.o. male seen today for 6 months follow-up for medical management of chronic diseases.   Bilateral chronic knee pain -states left knee pain worse than the right knee.  Tends to buckle when walking.  He denies any swelling or redness of the knees.  Wears knee braces that helps to prevent knees giving out.  Over-the-counter analgesics helps relieve the pain.  Gout -reports no flareups.He continues to stay off foods and drinks that causes gout to flareup.  Hypertension -no home blood pressure readings for evaluation.He denies any headache,dizziness,vision changes,fatigue,chest tightness,palpitation,chest pain or shortness of breath.     Hyperlipidemia -copy of recent lab work printed and given to patient.  Lab results reviewed and discussed during visit.Total cholesterol and HDL are stable. Triglycerides was 124 which has improved from 151 3 years ago to 101 6 months ago.  LDL is still slightly high but has also improved from 128 3 years ago and 119  6 months ago.  States trying to eat heart healthy diet.  Past Medical History:  Diagnosis Date   Gout    Hypertension    Past Surgical History:  Procedure Laterality Date   OTHER SURGICAL HISTORY  1960   Hernia     Allergies[1]  Allergies as of 06/05/2024       Reactions   Shellfish Allergy         Medication List        Accurate as of June 05, 2024 11:59 PM. If you have any questions,  ask your nurse or doctor.          acetaminophen  500 MG tablet Commonly known as: TYLENOL  Take 1 tablet (500 mg total) by mouth every 8 (eight) hours as needed.   amLODipine  5 MG tablet Commonly known as: NORVASC  TAKE 1 TABLET (5 MG TOTAL) BY MOUTH DAILY. NEEDS AN APPOINTMENT BEFORE ANYMORE FUTURE REFILLS.   hydrochlorothiazide  12.5 MG tablet Commonly known as: HYDRODIURIL  Take 1 tablet (12.5 mg total) by mouth daily. Started by: Manasi Dishon, NP   lisinopril 40 MG tablet Commonly known as: ZESTRIL Take 40 mg by mouth every morning.   meloxicam 15 MG tablet Commonly known as: MOBIC Take 15 mg by mouth daily as needed for pain.   metoprolol  succinate 25 MG 24 hr tablet Commonly known as: TOPROL -XL Take 1 tablet (25 mg total) by mouth daily.   MULTIVITAMIN ADULT PO Take 1 capsule by mouth daily.   naproxen  500 MG tablet Commonly known as: NAPROSYN  Take 1 tablet (500 mg total) by mouth 2 (two) times daily.   Spikevax  injection Generic drug: COVID-19 mRNA vaccine (Moderna, >/= 20yrs) Inject 0.5 mLs into the muscle as directed.   VITAMIN B12 PO Take 1 capsule by mouth daily at 2 am.   VITAMIN D  PO Take 1 capsule by mouth daily.        Review of Systems  Constitutional:  Negative for appetite change, chills, fatigue, fever and  unexpected weight change.  HENT:  Negative for congestion, ear discharge, ear pain, hearing loss, nosebleeds, postnasal drip, rhinorrhea, sinus pressure, sinus pain, sneezing, sore throat, tinnitus and trouble swallowing.   Eyes:  Negative for pain, discharge, redness, itching and visual disturbance.  Respiratory:  Negative for cough, chest tightness, shortness of breath and wheezing.   Cardiovascular:  Negative for chest pain, palpitations and leg swelling.  Gastrointestinal:  Negative for abdominal distention, abdominal pain, blood in stool, constipation, diarrhea, nausea and vomiting.  Endocrine: Negative for cold intolerance, heat  intolerance, polydipsia, polyphagia and polyuria.  Genitourinary:  Negative for difficulty urinating, dysuria, flank pain, frequency and urgency.  Musculoskeletal:  Positive for arthralgias. Negative for back pain, gait problem, joint swelling, myalgias, neck pain and neck stiffness.       Chronic bilateral knee pain  Skin:  Negative for color change, pallor, rash and wound.  Neurological:  Negative for dizziness, syncope, speech difficulty, weakness, light-headedness, numbness and headaches.  Hematological:  Does not bruise/bleed easily.  Psychiatric/Behavioral:  Negative for agitation, behavioral problems, confusion, hallucinations, self-injury, sleep disturbance and suicidal ideas. The patient is not nervous/anxious.     Immunization History  Administered Date(s) Administered   Fluad Quad(high Dose 65+) 05/26/2022   INFLUENZA, HIGH DOSE SEASONAL PF 02/22/2023   Influenza,inj,Quad PF,6+ Mos 04/27/2016, 04/26/2017, 06/19/2018   Influenza-Unspecified 05/13/2015, 04/26/2019, 04/14/2020, 03/25/2021   PFIZER(Purple Top)SARS-COV-2 Vaccination 08/17/2019, 09/07/2019, 04/14/2020   PPD Test 09/08/2003   Pfizer(Comirnaty)Fall Seasonal Vaccine 12 years and older 05/26/2022, 12/23/2023, 03/07/2024   Pneumococcal Polysaccharide-23 04/27/2016, 05/26/2022   Td 05/06/2003   Td (Adult), 2 Lf Tetanus Toxid, Preservative Free 05/06/2003   Tdap 05/05/2013, 10/06/2015, 03/16/2023   Unspecified SARS-COV-2 Vaccination 02/22/2023   Pertinent  Health Maintenance Due  Topic Date Due   Colonoscopy  Never done   Influenza Vaccine  Completed      03/25/2023    1:11 PM 04/11/2023   10:42 AM 05/13/2023   10:52 AM 12/05/2023    9:55 AM 06/05/2024    9:53 AM  Fall Risk  Falls in the past year? 1 1 0 1 0  Was there an injury with Fall? 1  1  0  1  0  Fall Risk Category Calculator 2 2 0 3 0  Patient at Risk for Falls Due to   No Fall Risks No Fall Risks No Fall Risks  Fall risk Follow up   Falls evaluation  completed Falls evaluation completed Falls evaluation completed     Data saved with a previous flowsheet row definition   Functional Status Survey:    Vitals:   06/05/24 0842  BP: 138/80  Pulse: 76  Temp: 98.4 F (36.9 C)  SpO2: 99%  Weight: 197 lb 6.4 oz (89.5 kg)  Height: 5' 10 (1.778 m)   Body mass index is 28.32 kg/m. Physical Exam Vitals reviewed.  Constitutional:      General: He is not in acute distress.    Appearance: Normal appearance. He is overweight. He is not ill-appearing or diaphoretic.  HENT:     Head: Normocephalic.     Right Ear: Tympanic membrane, ear canal and external ear normal. There is no impacted cerumen.     Left Ear: Tympanic membrane, ear canal and external ear normal. There is no impacted cerumen.     Nose: Nose normal. No congestion or rhinorrhea.     Mouth/Throat:     Mouth: Mucous membranes are moist.     Pharynx: Oropharynx is clear. No oropharyngeal  exudate or posterior oropharyngeal erythema.  Eyes:     General: No scleral icterus.       Right eye: No discharge.        Left eye: No discharge.     Extraocular Movements: Extraocular movements intact.     Conjunctiva/sclera: Conjunctivae normal.     Pupils: Pupils are equal, round, and reactive to light.  Neck:     Vascular: No carotid bruit.  Cardiovascular:     Rate and Rhythm: Normal rate and regular rhythm.     Pulses: Normal pulses.     Heart sounds: Normal heart sounds. No murmur heard.    No friction rub. No gallop.  Pulmonary:     Effort: Pulmonary effort is normal. No respiratory distress.     Breath sounds: Normal breath sounds. No wheezing, rhonchi or rales.  Chest:     Chest wall: No tenderness.  Abdominal:     General: Bowel sounds are normal. There is no distension.     Palpations: Abdomen is soft. There is no mass.     Tenderness: There is no abdominal tenderness. There is no right CVA tenderness, left CVA tenderness, guarding or rebound.  Musculoskeletal:         General: No swelling or tenderness. Normal range of motion.     Cervical back: Normal range of motion. No rigidity or tenderness.     Right lower leg: No edema.     Left lower leg: No edema.  Lymphadenopathy:     Cervical: No cervical adenopathy.  Skin:    General: Skin is warm and dry.     Coloration: Skin is not pale.     Findings: No bruising, erythema, lesion or rash.  Neurological:     Mental Status: He is alert and oriented to person, place, and time.     Cranial Nerves: No cranial nerve deficit.     Sensory: No sensory deficit.     Motor: No weakness.     Coordination: Coordination normal.     Gait: Gait normal.  Psychiatric:        Mood and Affect: Mood normal.        Speech: Speech normal.        Behavior: Behavior normal.        Thought Content: Thought content normal.        Judgment: Judgment normal.     Labs reviewed: Recent Labs    12/05/23 1040 06/03/24 0916 06/05/24 1709  NA 138 139 141  K 4.3 4.3 4.0  CL 105 109 107  CO2 24 23 26   GLUCOSE 91 85 112*  BUN 33* 43* 35*  CREATININE 0.98 1.04 0.93  CALCIUM 9.5 9.3 8.9  MG  --   --  2.2   Recent Labs    12/05/23 1040 06/03/24 0916 06/05/24 1709  AST 35 45* 52*  ALT 34 46 55*  ALKPHOS  --   --  57  BILITOT 0.8 0.7 0.9  PROT 7.4 7.4 7.6  ALBUMIN  --   --  3.6   Recent Labs    12/05/23 1040 06/03/24 0916 06/05/24 1709  WBC 5.2 5.5 6.2  NEUTROABS 2,844 2,635 4.0  HGB 12.8* 13.4 13.4  HCT 39.1 41.1 39.8  MCV 101.3* 99.3 98.0  PLT 217 265 257   Lab Results  Component Value Date   TSH 0.96 06/03/2024   No results found for: HGBA1C Lab Results  Component Value Date   CHOL 177 06/03/2024  HDL 40 06/03/2024   LDLCALC 114 (H) 06/03/2024   TRIG 124 06/03/2024   CHOLHDL 4.4 06/03/2024    Significant Diagnostic Results in last 30 days:  DG Chest 1 View Result Date: 06/05/2024 EXAM: 1 VIEW XRAY OF THE CHEST 06/05/2024 05:05:00 PM COMPARISON: None available. CLINICAL HISTORY: Fall.  FINDINGS: LUNGS AND PLEURA: No focal pulmonary opacity. No pleural effusion. No pneumothorax. HEART AND MEDIASTINUM: No acute abnormality of the cardiac and mediastinal silhouettes. BONES AND SOFT TISSUES: No acute osseous abnormality. IMPRESSION: 1. No acute cardiopulmonary process. Electronically signed by: Pinkie Pebbles MD 06/05/2024 06:08 PM EST RP Workstation: [id number]   CT Cervical Spine Wo Contrast Result Date: 06/05/2024 EXAM: CT CERVICAL SPINE WITHOUT CONTRAST 06/05/2024 05:26:14 PM TECHNIQUE: CT of the cervical spine was performed without the administration of intravenous contrast. Multiplanar reformatted images are provided for review. Automated exposure control, iterative reconstruction, and/or weight based adjustment of the mA/kV was utilized to reduce the radiation dose to as low as reasonably achievable. COMPARISON: None available. CLINICAL HISTORY: Fall, not on blood thinners FINDINGS: CERVICAL SPINE: BONES AND ALIGNMENT: No acute fracture or traumatic malalignment. DEGENERATIVE CHANGES: No significant degenerative changes. SOFT TISSUES: No prevertebral soft tissue swelling. IMPRESSION: 1. No acute abnormality of the cervical spine. Electronically signed by: Pinkie Pebbles MD 06/05/2024 06:08 PM EST RP Workstation: [id number]   CT Head Wo Contrast Result Date: 06/05/2024 EXAM: CT HEAD WITHOUT 06/05/2024 05:26:14 PM TECHNIQUE: CT of the head was performed without the administration of intravenous contrast. Automated exposure control, iterative reconstruction, and/or weight based adjustment of the mA/kV was utilized to reduce the radiation dose to as low as reasonably achievable. COMPARISON: 03/16/2023 CLINICAL HISTORY: fall, not on thinners, lightheadedness FINDINGS: BRAIN AND VENTRICLES: No acute intracranial hemorrhage. No mass effect or midline shift. No extra-axial fluid collection. No evidence of acute infarct. No hydrocephalus. Global cortical atrophy. Subcortical and  periventricular small vessel ischemic changes. Intracranial atherosclerosis. ORBITS: No acute abnormality. SINUSES AND MASTOIDS: No acute abnormality. SOFT TISSUES AND SKULL: No acute skull fracture. Right parietal scalp hematoma. IMPRESSION: 1. No acute intracranial abnormality. 2. Right parietal scalp hematoma. Electronically signed by: Pinkie Pebbles MD 06/05/2024 06:07 PM EST RP Workstation: [id number]   DG Knee 2 Views Left Result Date: 06/05/2024 EXAM: 2 VIEW(S) XRAY OF THE LEFT KNEE 06/05/2024 05:05:00 PM COMPARISON: None available. CLINICAL HISTORY: fall on back and head FINDINGS: BONES AND JOINTS: No acute fracture. No malalignment. No significant joint effusion. Mild degenerative changes of the knee. External brace in place. SOFT TISSUES: Vascular calcifications. IMPRESSION: 1. No acute fracture or dislocation. Electronically signed by: Pinkie Pebbles MD 06/05/2024 06:06 PM EST RP Workstation: [id number]    Assessment/Plan 1. Primary hypertension (Primary) Blood pressure stable -Continue on amlodipine , hydrochlorothiazide , lisinopril and metoprolol  succinate. - TSH; Future - CBC with Differential/Platelet; Future - Complete Metabolic Panel with eGFR; Future  2. Mixed hyperlipidemia  total cholesterol, HDL and triglycerides are at goal except LDL is still slightly elevated 114 -Continue with dietary modification and exercise as tolerated - Lipid panel; Future  3. Vitamin D  deficiency Continue on vitamin D  supplement  4. Vitamin B12 deficiency Continue on vitamin B12 supplement  5. Elevated liver enzymes AST still elevated at 45 but has improved -Advised to continue to avoid any alcohol and use Tylenol  and Tylenol  containing products. - Hepatic function panel; Future  6. Chronic pain of both knees Pain worst on left knee tends to buckle when walking. -Advised to use knee brace all the time when walking to prevent  falls. Will obtain imaging to rule out other acute  abnormalities.  Address to [facility] imaging at [address]. given to get x-rays on both knees. - DG Knee Complete 4 Views Right; Future - DG Knee Complete 4 Views Left; Future - Continue with current pain regimen - Will refer to orthopedic once x-ray obtained  7. Edema, lower extremity Edema has improved.Creatinine and electrolytes are within normal range will continue hydrochlorothiazide . - hydrochlorothiazide  (HYDRODIURIL ) 12.5 MG tablet; Take 1 tablet (12.5 mg total) by mouth daily.  Dispense: 90 tablet; Refill: 3 - Basic metabolic panel with GFR; Future  Family/ staff Communication: Reviewed plan of care with patient verbalized understanding  Labs/tests ordered:  - TSH; Future - CBC with Differential/Platelet; Future - Hepatic function panel; Future - Lipid panel; Future - Complete Metabolic Panel with eGFR; Future - DG Knee Complete 4 Views Right; Future - DG Knee Complete 4 Views Left; Future  Next Appointment : Return in about 6 months (around 12/04/2024) for medical mangement of chronic issues., Fasting labs in 6 months prior to visit.hepatic panel in 4wks.SABRA   Spent 30 minutes of Face to face and non-face to face with patient  >50% time spent counseling; reviewing medical record; tests; labs; documentation and developing future plan of care.   Johnhenry Tippin C Symphonie Schneiderman, NP      [1]  Allergies Allergen Reactions   Shellfish Allergy    "

## 2024-06-20 ENCOUNTER — Other Ambulatory Visit: Payer: Self-pay | Admitting: Family

## 2024-06-20 DIAGNOSIS — I10 Essential (primary) hypertension: Secondary | ICD-10-CM

## 2024-07-06 ENCOUNTER — Other Ambulatory Visit

## 2024-07-06 DIAGNOSIS — R748 Abnormal levels of other serum enzymes: Secondary | ICD-10-CM

## 2024-07-06 DIAGNOSIS — R600 Localized edema: Secondary | ICD-10-CM

## 2024-07-06 DIAGNOSIS — I10 Essential (primary) hypertension: Secondary | ICD-10-CM

## 2024-07-06 DIAGNOSIS — E782 Mixed hyperlipidemia: Secondary | ICD-10-CM

## 2024-07-07 LAB — LIPID PANEL
Cholesterol: 200 mg/dL — ABNORMAL HIGH
HDL: 43 mg/dL
LDL Cholesterol (Calc): 135 mg/dL — ABNORMAL HIGH
Non-HDL Cholesterol (Calc): 157 mg/dL — ABNORMAL HIGH
Total CHOL/HDL Ratio: 4.7 (calc)
Triglycerides: 113 mg/dL

## 2024-07-07 LAB — BASIC METABOLIC PANEL WITH GFR
BUN/Creatinine Ratio: 54 (calc) — ABNORMAL HIGH (ref 6–22)
BUN: 57 mg/dL — ABNORMAL HIGH (ref 7–25)
CO2: 24 mmol/L (ref 20–32)
Calcium: 9.4 mg/dL (ref 8.6–10.3)
Chloride: 106 mmol/L (ref 98–110)
Creat: 1.06 mg/dL (ref 0.70–1.28)
Glucose, Bld: 84 mg/dL (ref 65–99)
Potassium: 4.6 mmol/L (ref 3.5–5.3)
Sodium: 138 mmol/L (ref 135–146)
eGFR: 75 mL/min/1.73m2

## 2024-07-07 LAB — HEPATIC FUNCTION PANEL
AG Ratio: 1.2 (calc) (ref 1.0–2.5)
ALT: 42 U/L (ref 9–46)
AST: 38 U/L — ABNORMAL HIGH (ref 10–35)
Albumin: 4.3 g/dL (ref 3.6–5.1)
Alkaline phosphatase (APISO): 67 U/L (ref 35–144)
Bilirubin, Direct: 0.1 mg/dL (ref 0.0–0.2)
Globulin: 3.7 g/dL (ref 1.9–3.7)
Indirect Bilirubin: 0.5 mg/dL (ref 0.2–1.2)
Total Bilirubin: 0.6 mg/dL (ref 0.2–1.2)
Total Protein: 8 g/dL (ref 6.1–8.1)

## 2024-07-07 LAB — CBC WITH DIFFERENTIAL/PLATELET
Absolute Lymphocytes: 1975 {cells}/uL (ref 850–3900)
Absolute Monocytes: 320 {cells}/uL (ref 200–950)
Basophils Absolute: 30 {cells}/uL (ref 0–200)
Basophils Relative: 0.6 %
Eosinophils Absolute: 160 {cells}/uL (ref 15–500)
Eosinophils Relative: 3.2 %
HCT: 40.5 % (ref 39.4–51.1)
Hemoglobin: 13.3 g/dL (ref 13.2–17.1)
MCH: 32.3 pg (ref 27.0–33.0)
MCHC: 32.8 g/dL (ref 31.6–35.4)
MCV: 98.3 fL (ref 81.4–101.7)
MPV: 10.7 fL (ref 7.5–12.5)
Monocytes Relative: 6.4 %
Neutro Abs: 2515 {cells}/uL (ref 1500–7800)
Neutrophils Relative %: 50.3 %
Platelets: 235 Thousand/uL (ref 140–400)
RBC: 4.12 Million/uL — ABNORMAL LOW (ref 4.20–5.80)
RDW: 15.5 % — ABNORMAL HIGH (ref 11.0–15.0)
Total Lymphocyte: 39.5 %
WBC: 5 Thousand/uL (ref 3.8–10.8)

## 2024-07-07 LAB — COMPLETE METABOLIC PANEL WITHOUT GFR
AG Ratio: 1.2 (calc) (ref 1.0–2.5)
ALT: 44 U/L (ref 9–46)
AST: 40 U/L — ABNORMAL HIGH (ref 10–35)
Albumin: 4.3 g/dL (ref 3.6–5.1)
Alkaline phosphatase (APISO): 68 U/L (ref 35–144)
BUN/Creatinine Ratio: 54 (calc) — ABNORMAL HIGH (ref 6–22)
BUN: 57 mg/dL — ABNORMAL HIGH (ref 7–25)
CO2: 24 mmol/L (ref 20–32)
Calcium: 9.4 mg/dL (ref 8.6–10.3)
Chloride: 106 mmol/L (ref 98–110)
Creat: 1.06 mg/dL (ref 0.70–1.28)
Globulin: 3.5 g/dL (ref 1.9–3.7)
Glucose, Bld: 84 mg/dL (ref 65–99)
Potassium: 4.6 mmol/L (ref 3.5–5.3)
Sodium: 138 mmol/L (ref 135–146)
Total Bilirubin: 0.6 mg/dL (ref 0.2–1.2)
Total Protein: 7.8 g/dL (ref 6.1–8.1)

## 2024-07-07 LAB — TSH: TSH: 0.78 m[IU]/L (ref 0.40–4.50)

## 2024-07-09 ENCOUNTER — Telehealth: Payer: Self-pay

## 2024-07-09 NOTE — Telephone Encounter (Signed)
 Cortizone injection not on medication list.please call [facility] to prescribe medication and [facility].

## 2024-07-09 NOTE — Telephone Encounter (Signed)
 Copied from CRM #[id number]. Topic: Clinical - Prescription Issue >> Jul 09, 2024  9:28 AM Cherylann RAMAN wrote: Reason for CRM: Patient is requesting a Authorization sent to [facility] for a Cortizone injection. Patient normally get them at the [place]. Last injection was May of 2025. Patient went to the [place] urgent care does not have any openings

## 2024-07-10 ENCOUNTER — Ambulatory Visit: Payer: Self-pay | Admitting: Family

## 2024-07-10 ENCOUNTER — Encounter: Payer: Self-pay | Admitting: Family

## 2024-07-10 ENCOUNTER — Ambulatory Visit: Admitting: Family

## 2024-07-10 VITALS — BP 124/78 | HR 76 | Temp 97.3°F | Ht 70.0 in | Wt 186.4 lb

## 2024-07-10 DIAGNOSIS — M25562 Pain in left knee: Secondary | ICD-10-CM | POA: Diagnosis not present

## 2024-07-10 DIAGNOSIS — E782 Mixed hyperlipidemia: Secondary | ICD-10-CM

## 2024-07-10 DIAGNOSIS — M25662 Stiffness of left knee, not elsewhere classified: Secondary | ICD-10-CM | POA: Diagnosis not present

## 2024-07-10 DIAGNOSIS — G8929 Other chronic pain: Secondary | ICD-10-CM | POA: Diagnosis not present

## 2024-07-10 DIAGNOSIS — M25561 Pain in right knee: Secondary | ICD-10-CM | POA: Diagnosis not present

## 2024-07-10 DIAGNOSIS — E860 Dehydration: Secondary | ICD-10-CM | POA: Diagnosis not present

## 2024-07-10 DIAGNOSIS — M25661 Stiffness of right knee, not elsewhere classified: Secondary | ICD-10-CM | POA: Diagnosis not present

## 2024-07-10 DIAGNOSIS — I10 Essential (primary) hypertension: Secondary | ICD-10-CM

## 2024-07-10 NOTE — Patient Instructions (Signed)
Due for pneumonia and shingles vaccine

## 2024-07-10 NOTE — Progress Notes (Signed)
 "  Provider: Allex Madia FNP-C   Lileigh Fahringer, Roxan BROCKS, NP  Patient Care Team: Izzak Fries, Roxan BROCKS, NP as PCP - General (Family Medicine)  Extended Emergency Contact Information Primary Emergency Contact: Schnoor,Ronnie Home Phone: [phone] Relation: Other  Code Status: Full code Goals of care: Advanced Directive information    06/05/2024    3:52 PM  Advanced Directives  Does Patient Have a Medical Advance Directive? No     Chief Complaint  Patient presents with   knee concerns    B/L knee concerns. Discuss the need for annual wellness, and colonoscopy.    Fall    Patient reports 2 recent falls this week. No hospitalization needed.     History of Present Illness   Wayne Morgan is a 74 year old male who presents with episodes of knee buckling and falls.  He experiences episodes of knee buckling, leading to falls, which occurred twice this [place] while at the mall. He describes the sensation as his knees 'just give out' like 'breaks on a car.' No pain in the knees or lower back, but he notes weakness in the knees and an inability to get up on his own. He attributes this to muscle weakness affecting his ability to stand without assistance.  He recalls receiving a cortisone shot from the [facility] over six months ago, which typically provides relief for about six months. However, he has been unable to schedule another cort[facility]one injection due [facility] and a change in his insurance carrier to [facility], which requires a recommendation for cortisone administration.  He mentions having had x-rays done at the [facility] and during a previous ER visit following a fall, which included scans of his head and knees. His knees feel stiff and sometimes puffy. He wraps his knees with gauze and a brace to stabilize them. 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  He is currently taking amlodipine  5 mg for  blood pressure, Tylenol , hydrochlorothiazide , lisinopril, Mobic for pain as needed, metoprolol  for blood pressure, and naproxen . He also engages in [employer] exercises to improve circulation and muscle strength.  No pain in the knees or lower back, no numbness or tingling in the legs, and no swelling behind the knees. He reports stiffness in the knees and difficulty standing up due to muscle weakness. No bowel movement irregularities and no pain or tenderness in the abdomen.     Past Medical History:  Diagnosis Date   Gout    Hypertension    Past Surgical History:  Procedure Laterality Date   OTHER SURGICAL HISTORY  1960   Hernia     Allergies[1]  Allergies as of 07/10/2024       Reactions   Shellfish Allergy         Medication List        Accurate as of July 10, 2024  4:32 PM. If you have any questions, ask your nurse or doctor.          acetaminophen  500 MG tablet Commonly known as: TYLENOL  Take 1 tablet (500 mg total) by mouth every 8 (eight) hours as needed.   amLODipine  5 MG tablet Commonly known as: NORVASC  Take 1 tablet (5 mg total) by mouth daily.   hydrochlorothiazide  12.5 MG tablet Commonly known as: HYDRODIURIL  Take 1 tablet (12.5 mg total) by mouth daily.   lisinopril 40 MG tablet Commonly known as: ZESTRIL Take 40  mg by mouth every morning.   meloxicam 15 MG tablet Commonly known as: MOBIC Take 15 mg by mouth daily as needed for pain.   metoprolol  succinate 25 MG 24 hr tablet Commonly known as: TOPROL -XL Take 1 tablet (25 mg total) by mouth daily.   MULTIVITAMIN ADULT PO Take 1 capsule by mouth daily.   naproxen  500 MG tablet Commonly known as: NAPROSYN  Take 1 tablet (500 mg total) by mouth 2 (two) times daily.   Spikevax  injection Generic drug: COVID-19 mRNA vaccine (Moderna, >/= 64yrs) Inject 0.5 mLs into the muscle as directed.   VITAMIN B12 PO Take 1 capsule by mouth daily at 2 am.   VITAMIN D  PO Take 1 capsule by mouth  daily.        Review of Systems  Constitutional:  Negative for appetite change, chills, fatigue, fever and unexpected weight change.  HENT:  Negative for congestion, ear discharge, ear pain, hearing loss, nosebleeds, postnasal drip, rhinorrhea, sinus pressure, sinus pain, sneezing, sore throat and tinnitus.   Eyes:  Negative for pain, discharge, redness, itching and visual disturbance.  Respiratory:  Negative for cough, chest tightness, shortness of breath and wheezing.   Cardiovascular:  Negative for chest pain, palpitations and leg swelling.  Gastrointestinal:  Negative for abdominal distention, abdominal pain, blood in stool, constipation, diarrhea, nausea and vomiting.  Endocrine: Negative for cold intolerance, heat intolerance, polydipsia, polyphagia and polyuria.  Genitourinary:  Negative for difficulty urinating, dysuria, flank pain, frequency and urgency.  Musculoskeletal:  Positive for arthralgias and gait problem. Negative for back pain, joint swelling, myalgias, neck pain and neck stiffness.       Bilateral knee  bucking up causing frequent fall episode   Skin:  Negative for color change, pallor, rash and wound.  Neurological:  Negative for dizziness, syncope, speech difficulty, weakness, light-headedness, numbness and headaches.  Psychiatric/Behavioral:  Negative for agitation, behavioral problems, confusion, hallucinations and sleep disturbance. The patient is not nervous/anxious.     Immunization History  Administered Date(s) Administered   Fluad Quad(high Dose 65+) 05/26/2022   INFLUENZA, HIGH DOSE SEASONAL PF 02/22/2023, 03/05/2024   Influenza,inj,Quad PF,6+ Mos 04/27/2016, 04/26/2017, 06/19/2018   Influenza-Unspecified 05/13/2015, 04/26/2019, 04/14/2020, 03/25/2021   PFIZER(Purple Top)SARS-COV-2 Vaccination 08/17/2019, 09/07/2019, 04/14/2020   PPD Test 09/08/2003   Pfizer(Comirnaty)Fall Seasonal Vaccine 12 years and older 05/26/2022, 12/23/2023, 03/07/2024    Pneumococcal Polysaccharide-23 04/27/2016, 05/26/2022   Td 05/06/2003   Td (Adult), 2 Lf Tetanus Toxid, Preservative Free 05/06/2003   Tdap 05/05/2013, 10/06/2015, 03/16/2023   Unspecified SARS-COV-2 Vaccination 02/22/2023   Pertinent  Health Maintenance Due  Topic Date Due   Colonoscopy  Never done   Influenza Vaccine  Completed      04/11/2023   10:42 AM 05/13/2023   10:52 AM 12/05/2023    9:55 AM 06/05/2024    9:53 AM 07/10/2024   11:07 AM  Fall Risk  Falls in the past year? 1 0 1 0 1  Was there an injury with Fall? 1  0  1  0 0  Fall Risk Category Calculator 2 0 3 0 2  Patient at Risk for Falls Due to  No Fall Risks No Fall Risks No Fall Risks History of fall(s);Impaired mobility  Fall risk Follow up  Falls evaluation completed Falls evaluation completed Falls evaluation completed Falls evaluation completed     Data saved with a previous flowsheet row definition   Functional Status Survey:    Vitals:   07/10/24 1111  BP: 124/78  Pulse: 76  Temp: (!) 97.3 F (36.3 C)  SpO2: 99%  Weight: 186 lb 6.4 oz (84.6 kg)  Height: 5' 10 (1.778 m)   Body mass index is 26.75 kg/m. Physical Exam  GENERAL: Alert, cooperative, well developed, no acute distress. HEENT: Normocephalic, normal oropharynx, moist mucous membranes. CHEST: Clear to auscultation bilaterally, no wheezes, rhonchi, or crackles. CARDIOVASCULAR: Normal heart rate and rhythm, S1 and S2 normal without murmurs. ABDOMEN: Soft, non-tender, non-distended, without organomegaly, normal bowel sounds. EXTREMITIES: No cyanosis or edema. MUSCULOSKELETAL: Right knee with fluid, feels warm, no pain on movement. Left knee without fluid, no pain on movement. No swelling behind the knees. NEUROLOGICAL: Cranial nerves grossly intact, moves all extremities without gross motor or sensory deficit.      Labs reviewed: Recent Labs    06/03/24 0916 06/05/24 1709 07/06/24 0840  NA 139 141 138  138  K 4.3 4.0 4.6  4.6  CL  109 107 106  106  CO2 23 26 24  24   GLUCOSE 85 112* 84  84  BUN 43* 35* 57*  57*  CREATININE 1.04 0.93 1.06  1.06  CALCIUM 9.3 8.9 9.4  9.4  MG  --  2.2  --    Recent Labs    06/03/24 0916 06/05/24 1709 07/06/24 0840  AST 45* 52* 38*  40*  ALT 46 55* 42  44  ALKPHOS  --  57  --   BILITOT 0.7 0.9 0.6  0.6  PROT 7.4 7.6 8.0  7.8  ALBUMIN  --  3.6  --    Recent Labs    06/03/24 0916 06/05/24 1709 07/06/24 0840  WBC 5.5 6.2 5.0  NEUTROABS 2,635 4.0 2,515  HGB 13.4 13.4 13.3  HCT 41.1 39.8 40.5  MCV 99.3 98.0 98.3  PLT 265 257 235   Lab Results  Component Value Date   TSH 0.78 07/06/2024   No results found for: HGBA1C Lab Results  Component Value Date   CHOL 200 (H) 07/06/2024   HDL 43 07/06/2024   LDLCALC 135 (H) 07/06/2024   TRIG 113 07/06/2024   CHOLHDL 4.7 07/06/2024    Significant Diagnostic Results in last 30 days:  No results found.  Assessment/Plan  Chronic bilateral knee pain and stiffness with right knee effusion Chronic bilateral knee pain and stiffness with recent episodes of knee buckling and falls. Right knee effusion with warmth and fluid accumulation. Previous cortisone injections provided temporary relief. Differential includes Baker's cyst and small tear, but no surgical intervention was performed previously. - Placed urgent referral to orthopedic specialist for evaluation and management of knee effusion and potential cortisone injection. - Advised use of knee brace for stabilization. - Recommended [facility] for urgent care if pain worsens, with hours from 5:30 PM to 9 PM on weekdays and 9 AM to 2 PM on weekends. - Encouraged use of cane for mobility support.  Primary hypertension Hypertension managed with amlodipine , hydrochlorothiazide , lisinopril, and metoprolol . - Continue current antihypertensive medications.  Mixed hyperlipidemia Recent increase in total cholesterol and LDL levels. Dietary habits may have contributed to  the increase. Emphasis on dietary modifications to manage cholesterol levels. - Advised dietary modifications to reduce cholesterol intake, including avoiding deep-fried foods, high-fat meats, and excessive fruit consumption. - Recommended a balanced diet with lean meats, vegetables, and healthy oils. - Will recheck cholesterol levels in four months.  History of elevated liver enzymes Liver enzymes have been decreasing, with recent levels approaching normal range. Advised to avoid Tylenol  and alcohol to prevent  further elevation. - Continue to avoid Tylenol  and alcohol to prevent liver enzyme elevation.  Dehydration Mild dehydration noted on lab results. Importance of adequate hydration emphasized, especially in relation to pain management and overall health. - Increase water intake to six to eight glasses per day. - Encourage hydration before and after exercise.   Family/ staff Communication: Reviewed plan of care with patient verbalized understanding  Labs/tests ordered:  - CBC with Differential/Platelet - CMP with eGFR([facility]) - TSH - Lipid panel   Next Appointment : Return in about 3 months (around 10/08/2024), or if symptoms worsen or fail to improve, for medical mangement of chronic issues., fasting labs prior to visit.   Spent 30 minutes of Face to face and non-face to face with patient  >50% time spent counseling; reviewing medical record; tests; labs; documentation and developing future plan of care.   Davon Abdelaziz C Gehrig Patras, NP         [1]  Allergies Allergen Reactions   Shellfish Allergy    "

## 2024-07-13 ENCOUNTER — Ambulatory Visit (INDEPENDENT_AMBULATORY_CARE_PROVIDER_SITE_OTHER): Admitting: Physician Assistant

## 2024-07-13 ENCOUNTER — Encounter (HOSPITAL_BASED_OUTPATIENT_CLINIC_OR_DEPARTMENT_OTHER): Payer: Self-pay | Admitting: Physician Assistant

## 2024-07-13 ENCOUNTER — Ambulatory Visit (HOSPITAL_BASED_OUTPATIENT_CLINIC_OR_DEPARTMENT_OTHER)

## 2024-07-13 ENCOUNTER — Other Ambulatory Visit (HOSPITAL_BASED_OUTPATIENT_CLINIC_OR_DEPARTMENT_OTHER): Payer: Self-pay | Admitting: Physician Assistant

## 2024-07-13 DIAGNOSIS — M25561 Pain in right knee: Secondary | ICD-10-CM

## 2024-07-13 DIAGNOSIS — M25562 Pain in left knee: Secondary | ICD-10-CM | POA: Diagnosis not present

## 2024-07-13 DIAGNOSIS — M47812 Spondylosis without myelopathy or radiculopathy, cervical region: Secondary | ICD-10-CM | POA: Diagnosis not present

## 2024-07-13 NOTE — Progress Notes (Signed)
 "  Office Visit Note   Patient: Wayne Morgan           Date of Birth: 1950/08/27           MRN: [id number] Visit Date: 07/13/2024              Requested by: Leonarda Roxan BROCKS, NP [id number] [place],  [place] [id number] PCP: Leonarda Roxan BROCKS, NP   Assessment & Plan: Visit Diagnoses:  1. Acute pain of both knees     Plan: Patient is a pleasant 74 year old gentleman who is accompanied by his wife.  He has had a 1 year history of increasing weakness in his legs giving way.  Most recently he has fallen to the floor twice.  In the last couple months he has also developed loss of dexterity and strength in both of his hands.  He is using knee braces.  He has bilateral hand and knee muscular atrophy.  He has very poor dexterity of his hands.  I spoke with Dr. Genelle about the patient.  Significant concerns for cervical myelopathy.  Will get a stat MRI based on this would refer him to either neurology if there was no radiographic coordination or to Dr. Georgina if there is clearly evidence of cervical myelopathy  Follow-Up Instructions: Will discuss after MRI  Orders:  Orders Placed This Encounter  Procedures   DG Knee Complete 4 Views Left   DG Knee Complete 4 Views Right   No orders of the defined types were placed in this encounter.     Procedures: No procedures performed   Clinical Data: No additional findings.   Subjective: Chief Complaint  Patient presents with   Right Knee - Pain   Left Knee - Pain    HPI patient is a pleasant 74 year old gentleman comes in today with bilateral knee pain x 1 year.  He is using a cane he complains that his knees buckles.  He is not currently taking any medication for this.  No history of injury  Review of Systems  All other systems reviewed and are negative.    Objective: Vital Signs: There were no vitals taken for this visit.  Physical Exam Constitutional:      Appearance: Normal appearance.  Pulmonary:     Effort:  Pulmonary effort is normal.  Neurological:     General: No focal deficit present.     Mental Status: He is alert and oriented to person, place, and time.  Psychiatric:        Mood and Affect: Mood normal.        Behavior: Behavior normal.     Ortho Exam Examination he is wearing braces braces come off he is decreased strength and quadricep atrophy.  No effusion no erythema compartments are soft and nontender Of note coincident noting of his hands he again has atrophy of the intrinsic muscles of the hand very poor dexterity and grip strength.  He has strong pulses and has brisk capillary refill Specialty Comments:  No specialty comments available.  Imaging: No results found.   PMFS History: Patient Active Problem List   Diagnosis Date Noted   Mixed hyperlipidemia 07/10/2024   Chronic pain of both knees 07/10/2024   Current moderate episode of major depressive disorder, unspecified whether recurrent (HCC) 03/31/2023   PVD (peripheral vascular disease) 03/31/2023   Uncontrolled hypertension 09/25/2020   Idiopathic chronic gout without tophus 09/25/2020   Erectile dysfunction 08/02/2020   Past Medical History:  Diagnosis Date  Gout    Hypertension     Family History  Problem Relation Age of Onset   Cancer Mother    Stroke Father    Cancer Brother     Past Surgical History:  Procedure Laterality Date   OTHER SURGICAL HISTORY  1960   Hernia    Social History   Occupational History   Not on file  Tobacco Use   Smoking status: Former    Current packs/day: 0.50    Average packs/day: 0.5 packs/day for 25.0 years (12.5 ttl pk-yrs)    Types: Cigarettes   Smokeless tobacco: Never  Vaping Use   Vaping status: Not on file  Substance and Sexual Activity   Alcohol use: Not Currently   Drug use: Not Currently   Sexual activity: Not on file        "

## 2024-07-14 ENCOUNTER — Encounter (HOSPITAL_BASED_OUTPATIENT_CLINIC_OR_DEPARTMENT_OTHER): Payer: Self-pay | Admitting: Physician Assistant

## 2024-07-15 ENCOUNTER — Inpatient Hospital Stay: Admission: RE | Admit: 2024-07-15 | Discharge: 2024-07-15 | Attending: Physician Assistant

## 2024-07-15 DIAGNOSIS — M47812 Spondylosis without myelopathy or radiculopathy, cervical region: Secondary | ICD-10-CM

## 2024-07-15 NOTE — Telephone Encounter (Signed)
 Patient might need to call or go to their office to request refills.

## 2024-07-15 NOTE — Telephone Encounter (Signed)
 [facility] but I was on hold because I call the pharmacy and they stated that [facility]. I was on hold for a while and I was unable to speak with someone in primary care from the [facility] office. I will call them again on tomorrow morning.   Message sent to Ngetich, Dinah C, NP

## 2024-07-15 NOTE — Telephone Encounter (Signed)
 Left message on voicemail for patient to return call when available

## 2024-07-16 NOTE — Telephone Encounter (Signed)
 Noted.

## 2024-07-16 NOTE — Telephone Encounter (Signed)
 Spoke with patient this morning stating that he does not need the injections because he did the MRI on yesterday and not in any pain.   Message sent to Ngetich, Dinah C, NP

## 2024-07-20 ENCOUNTER — Telehealth: Payer: Self-pay

## 2024-07-20 ENCOUNTER — Other Ambulatory Visit

## 2024-07-20 NOTE — Telephone Encounter (Unsigned)
 Copied from CRM #[id number]. Topic: Clinical - Lab/Test Results >> Jul 20, 2024 11:32 AM Miquel SAILOR wrote: Reason for CRM: DG Knee 1-2 Views Right (Accession [id number]) (Order [id number]) DG Knee 1-2 Views Left (Accession [id number]) (Order [id number]) MR Cervical Spine Wo Contrast (Accession [id number]) (Order [id number])  Pt calling on update for results done for 01/19 and 01/21. Needs call back [phone]

## 2024-07-23 ENCOUNTER — Encounter (HOSPITAL_BASED_OUTPATIENT_CLINIC_OR_DEPARTMENT_OTHER): Payer: Self-pay

## 2024-07-23 ENCOUNTER — Other Ambulatory Visit: Payer: Self-pay | Admitting: Physician Assistant

## 2024-07-23 ENCOUNTER — Encounter: Payer: Self-pay | Admitting: Neurology

## 2024-07-23 DIAGNOSIS — R29898 Other symptoms and signs involving the musculoskeletal system: Secondary | ICD-10-CM

## 2024-07-24 ENCOUNTER — Telehealth: Payer: Self-pay | Admitting: Physician Assistant

## 2024-07-24 NOTE — Telephone Encounter (Signed)
 Pt called saying that he isn't sure who he needs to talk to exactly, but he is having trouble with his knees and is falling and is trying to figure out what to do. He is wanting recommendations as to what to do. Call back number is [phone].

## 2024-10-07 ENCOUNTER — Other Ambulatory Visit

## 2024-10-12 ENCOUNTER — Ambulatory Visit: Admitting: Family

## 2024-10-19 ENCOUNTER — Ambulatory Visit: Payer: Self-pay | Admitting: Neurology

## 2024-12-07 ENCOUNTER — Ambulatory Visit: Admitting: Family
